# Patient Record
Sex: Female | Born: 1950 | Race: Black or African American | Hispanic: No | Marital: Married | Smoking: Never smoker
Health system: Southern US, Community
[De-identification: ages and names within clinical notes are randomized; demographics above are authoritative.]

## PROBLEM LIST (undated history)

## (undated) DIAGNOSIS — M1990 Unspecified osteoarthritis, unspecified site: Secondary | ICD-10-CM

## (undated) DIAGNOSIS — K589 Irritable bowel syndrome without diarrhea: Secondary | ICD-10-CM

## (undated) DIAGNOSIS — K269 Duodenal ulcer, unspecified as acute or chronic, without hemorrhage or perforation: Secondary | ICD-10-CM

## (undated) DIAGNOSIS — E669 Obesity, unspecified: Secondary | ICD-10-CM

## (undated) DIAGNOSIS — B9681 Helicobacter pylori [H. pylori] as the cause of diseases classified elsewhere: Secondary | ICD-10-CM

## (undated) DIAGNOSIS — H269 Unspecified cataract: Secondary | ICD-10-CM

## (undated) DIAGNOSIS — K5790 Diverticulosis of intestine, part unspecified, without perforation or abscess without bleeding: Secondary | ICD-10-CM

## (undated) DIAGNOSIS — R7303 Prediabetes: Secondary | ICD-10-CM

## (undated) DIAGNOSIS — I10 Essential (primary) hypertension: Secondary | ICD-10-CM

## (undated) DIAGNOSIS — K279 Peptic ulcer, site unspecified, unspecified as acute or chronic, without hemorrhage or perforation: Secondary | ICD-10-CM

## (undated) DIAGNOSIS — K219 Gastro-esophageal reflux disease without esophagitis: Secondary | ICD-10-CM

## (undated) HISTORY — PX: CATARACT EXTRACTION, BILATERAL: SHX1313

## (undated) HISTORY — DX: Gastro-esophageal reflux disease without esophagitis: K21.9

## (undated) HISTORY — PX: UTERINE FIBROID SURGERY: SHX826

## (undated) HISTORY — DX: Obesity, unspecified: E66.9

## (undated) HISTORY — DX: Unspecified cataract: H26.9

## (undated) HISTORY — PX: TUBAL LIGATION: SHX77

## (undated) HISTORY — PX: ABDOMINAL HYSTERECTOMY: SHX81

## (undated) HISTORY — PX: BLADDER SUSPENSION: SHX72

---

## 1999-02-05 ENCOUNTER — Other Ambulatory Visit: Admission: RE | Admit: 1999-02-05 | Discharge: 1999-02-05 | Payer: Self-pay | Admitting: Obstetrics and Gynecology

## 2001-04-22 ENCOUNTER — Other Ambulatory Visit: Admission: RE | Admit: 2001-04-22 | Discharge: 2001-04-22 | Payer: Self-pay | Admitting: Obstetrics and Gynecology

## 2001-06-09 ENCOUNTER — Ambulatory Visit (HOSPITAL_COMMUNITY): Admission: RE | Admit: 2001-06-09 | Discharge: 2001-06-09 | Payer: Self-pay | Admitting: Obstetrics and Gynecology

## 2001-06-09 ENCOUNTER — Encounter (INDEPENDENT_AMBULATORY_CARE_PROVIDER_SITE_OTHER): Payer: Self-pay | Admitting: Specialist

## 2002-08-18 ENCOUNTER — Other Ambulatory Visit: Admission: RE | Admit: 2002-08-18 | Discharge: 2002-08-18 | Payer: Self-pay | Admitting: Obstetrics and Gynecology

## 2003-10-17 ENCOUNTER — Other Ambulatory Visit: Admission: RE | Admit: 2003-10-17 | Discharge: 2003-10-17 | Payer: Self-pay | Admitting: Obstetrics and Gynecology

## 2005-01-16 ENCOUNTER — Other Ambulatory Visit: Admission: RE | Admit: 2005-01-16 | Discharge: 2005-01-16 | Payer: Self-pay | Admitting: Obstetrics and Gynecology

## 2005-02-01 ENCOUNTER — Encounter: Admission: RE | Admit: 2005-02-01 | Discharge: 2005-02-01 | Payer: Self-pay | Admitting: Obstetrics and Gynecology

## 2006-02-18 ENCOUNTER — Encounter (INDEPENDENT_AMBULATORY_CARE_PROVIDER_SITE_OTHER): Payer: Self-pay | Admitting: *Deleted

## 2006-02-18 ENCOUNTER — Ambulatory Visit (HOSPITAL_COMMUNITY): Admission: RE | Admit: 2006-02-18 | Discharge: 2006-02-19 | Payer: Self-pay | Admitting: Obstetrics and Gynecology

## 2006-03-04 ENCOUNTER — Encounter: Admission: RE | Admit: 2006-03-04 | Discharge: 2006-03-04 | Payer: Self-pay | Admitting: Obstetrics and Gynecology

## 2007-03-23 ENCOUNTER — Encounter: Admission: RE | Admit: 2007-03-23 | Discharge: 2007-03-23 | Payer: Self-pay | Admitting: Obstetrics and Gynecology

## 2007-04-23 ENCOUNTER — Ambulatory Visit: Payer: Self-pay | Admitting: Gastroenterology

## 2007-05-06 ENCOUNTER — Ambulatory Visit: Payer: Self-pay | Admitting: Gastroenterology

## 2009-03-07 ENCOUNTER — Ambulatory Visit (HOSPITAL_COMMUNITY): Admission: RE | Admit: 2009-03-07 | Discharge: 2009-03-08 | Payer: Self-pay | Admitting: Obstetrics and Gynecology

## 2010-10-14 ENCOUNTER — Encounter: Payer: Self-pay | Admitting: Obstetrics and Gynecology

## 2010-12-31 LAB — URINALYSIS, ROUTINE W REFLEX MICROSCOPIC
Hgb urine dipstick: NEGATIVE
Ketones, ur: NEGATIVE mg/dL
Protein, ur: NEGATIVE mg/dL
Urobilinogen, UA: 0.2 mg/dL (ref 0.0–1.0)
pH: 6 (ref 5.0–8.0)

## 2010-12-31 LAB — CBC
Hemoglobin: 13.5 g/dL (ref 12.0–15.0)
MCHC: 35.2 g/dL (ref 30.0–36.0)
MCHC: 34.5 g/dL (ref 30.0–36.0)
MCV: 85.1 fL (ref 78.0–100.0)
RBC: 3.88 MIL/uL (ref 3.87–5.11)
RBC: 4.6 MIL/uL (ref 3.87–5.11)
RDW: 13.6 % (ref 11.5–15.5)
RDW: 13.1 % (ref 11.5–15.5)
WBC: 8.8 10*3/uL (ref 4.0–10.5)

## 2010-12-31 LAB — COMPREHENSIVE METABOLIC PANEL
AST: 18 U/L (ref 0–37)
BUN: 6 mg/dL (ref 6–23)
CO2: 31 mEq/L (ref 19–32)
Chloride: 100 mEq/L (ref 96–112)
GFR calc Af Amer: >60 mL/min (ref >60)
GFR calc non Af Amer: >60 mL/min (ref >60)
Glucose, Bld: 109 mg/dL — ABNORMAL HIGH (ref 70–99)
Potassium: 3.4 mEq/L — ABNORMAL LOW (ref 3.5–5.1)

## 2011-02-05 NOTE — H&P (Signed)
NAME:  Zentner, Mickaela                ACCOUNT NO.:  [id number]   MEDICAL RECORD NO.:  [id number]          PATIENT TYPE:  AMB   LOCATION:  SDC                           FACILITY:  [facility]   PHYSICIAN:  Guy Sandifer. Henderson Cloud, M.D. DATE OF BIRTH:  1951-06-02   DATE OF ADMISSION:  DATE OF DISCHARGE:                              HISTORY & PHYSICAL   CHIEF COMPLAINT:  Pelvic relaxation and stress urinary continence.   HISTORY OF PRESENT ILLNESS:  This patient is a 60 year old married black  female G2, P2 status post laparoscopically-assisted vaginal hysterectomy  with bilateral salpingo-oophorectomy.  She has complaints of sensation  that something is protruding from the vagina and is quite uncomfortable.  There is also leaking urine with coughing and sneezing.  Urodynamics  were consistent with stress urinary incontinence.  On exam, she has  pelvic relaxation.  After discussion of options, she is being admitted  for anterior repair with probable graft as well as posterior vaginal  repair and mid urethral sling.  Potential risks and complications have  been discussed preoperatively.   PAST MEDICAL HISTORY:  1. Arthritis.  2. Chronic hypertension.   PAST SURGICAL HISTORY:  1. LAVH-BSO.  2. Tubal ligation.   OBSTETRIC HISTORY:  Vaginal delivery x2.   MEDICATIONS:  Lisinopril one half daily, vaginal estrogen cream twice a  week, Vivelle-Dot patch twice a week.   ALLERGIES:  No known drug allergies.   SOCIAL HISTORY:  Denies tobacco, alcohol, or drug abuse.   FAMILY HISTORY:  Positive for heart disease, kidney disease, arthritis,  diabetes, and chronic hypertension.   REVIEW OF SYSTEMS:  NEURO:  Denies headache.  CARDIAC:  Denies chest  pain.  PULMONARY:  Denies shortness of breath.   PHYSICAL EXAMINATION:  VITAL SIGNS:  Height 5 feet 4-1/2 inches, weight  213.4 pounds, blood pressure 110/72.  HEENT:  Without thyromegaly.  LUNGS:  Clear to auscultation.  HEART:  Regular rate and rhythm.  ABDOMEN:  Soft, nontender without masses.  PELVIC:  Vagina without lesion.  Cystocele is at the vaginal introitus.  Adnexa, nontender without masses.  Rectovaginal exam reveals good rectal  sphincter tone with a lax rectovaginal septum.  EXTREMITIES:  Grossly within normal limits.  NEUROLOGIC:  Grossly within normal limits.   ASSESSMENT:  1. Pelvic relaxation.  2. Stress urinary continence.   PLAN:  Anterior and posterior repair with probable graft and mid  urethral sling.      Guy Sandifer Henderson Cloud, M.D.  Electronically Signed     JET/MEDQ  D:  03/02/2009  T:  03/03/2009  Job:  [id number]

## 2011-02-05 NOTE — Discharge Summary (Signed)
NAME:  [name], Lori                ACCOUNT NO.:  [id number]   MEDICAL RECORD NO.:  [id number]          PATIENT TYPE:  OIB   LOCATION:  9311                          FACILITY:  [facility]   PHYSICIAN:  Guy Sandifer. Henderson Cloud, M.D. DATE OF BIRTH:  May 22, 1951   DATE OF ADMISSION:  03/07/2009  DATE OF DISCHARGE:  03/08/2009                               DISCHARGE SUMMARY   ADMITTING DIAGNOSES:  1. Pelvic relaxation.  2. Stress urinary incontinence.   DISCHARGE DIAGNOSES:  1. Pelvic relaxation.  2. Stress urinary incontinence.   PROCEDURE:  Solyx midurethral sling, anterior colporrhaphy with repair  of cystocele, vaginal colpopexy, insertion of mesh, posterior  colporrhaphy with repair of rectocele, and cystoscopy on March 07, 2009.   REASON FOR ADMISSION:  This patient is a 60 year old G2, P2 status post  LAVH-BSO with symptomatic pelvic relaxation and stress incontinence.  Details are dictated in the history and physical.  She is admitted for  surgical management.   HOSPITAL COURSE:  The patient admitted to the hospital, undergoes the  above procedures.  She tolerates them well and blood loss is 150 mL.  On  the evening of surgery, she has good pain relief.  Vital signs are  stable.  She remains afebrile with clear urine output.  On the day of  discharge, she has good pain relief.  Pack has been taken out.  Foley is  out.  She has not yet passed flatus or voided, but she is feeling well.  Abdomen is soft with excellent bowel sounds.  Hemoglobin is 11.7.  She  will be followed for at least 2 postvoid residual volumes prior to  discharge.   CONDITION ON DISCHARGE:  Good.   DIET:  Regular as tolerated.   ACTIVITY:  No lifting, no operation of automobiles, and no vaginal  entry.  She is to call our office for problems including but not limited  to temperature of 101 degrees; persistent nausea, vomiting; increasing  pain; or heavy vaginal bleeding.   MEDICATIONS:  1. Percocet 5/325 mg, #40,  1-2 p.o. q.6 h. p.r.n.  2. Ibuprofen 600 mg q.6 h. p.r.n.  3. Multivitamin daily.   She will resume her other preoperative medications.  She will resume  vaginal estrogen cream in about a week.   Followup is in the office in 3 weeks.      Guy Sandifer Henderson Cloud, M.D.  Electronically Signed     JET/MEDQ  D:  03/08/2009  T:  03/09/2009  Job:  [id number]

## 2011-02-05 NOTE — Op Note (Signed)
NAME:  Lori [name], Lori [name]                ACCOUNT NO.:  [id number]   MEDICAL RECORD NO.:  [id number]          PATIENT TYPE:  OIB   LOCATION:  9311                          FACILITY:  [facility]   PHYSICIAN:  Guy Sandifer. Henderson Cloud, M.D. DATE OF BIRTH:  1950/11/19   DATE OF PROCEDURE:  03/07/2009  DATE OF DISCHARGE:                               OPERATIVE REPORT   PREOPERATIVE DIAGNOSES:  1. Pelvic relaxation.  2. Stress urinary continence   POSTOPERATIVE DIAGNOSES:  1. Pelvic relaxation.  2. Stress urinary continence   PROCEDURES:  Solyx mid urethral sling, anterior colporrhaphy with repair  of cystocele, vaginal colpopexy, insertion of mesh, posterior colpopexy  with repair of rectocele, and cystoscopy.   SURGEON:  Guy Sandifer. Henderson Cloud, MD   ANESTHESIA:  General with endotracheal intubation.   ESTIMATED BLOOD LOSS:  150 mL.   INDICATIONS AND CONSENT:  This patient is a 61 year old married black  female G2, P2, status post LAVH-BSO with complaints of pelvic relaxation  and stress urinary continence.  Details are in the history and physical.  Anterior and posterior colporrhaphy with colpopexy and mesh as well as  mid urethral sling has been discussed preoperatively.  Potential risks  and complications have been discussed preoperatively including, but not  limited to infection, organ damage, bleeding requiring transfusion of  blood products with HIV and hepatitis acquisition, DVT, PE, pneumonia,  fistula formation, pelvic pain, laparotomy, need for postoperative  vaginal dilators, dyspareunia, recurrent pelvic relaxation.  Success and  failure rates of the mid urethral sling, recurrence of incontinence,  postoperative irritative voiding symptoms, return to the OR, prolonged  catheterization, self-catheterization, erosion and delayed healing of  the mesh had been reviewed as well.  All questions have been answered  and consent is signed on the chart.   PROCEDURE IN DETAILS:  The patient was taken to  the operating room where  she was identified, placed in dorsosupine position and general  anesthesia was induced via endotracheal intubation.  She was then placed  in the dorsal lithotomy position where she was prepped, bladder straight  catheterized and she draped in a sterile fashion.  The anterior vaginal  mucosa was injected with 0.5% lidocaine with 1:200,000 epinephrine.  A  transverse vaginal incision was made on the anterior vaginal wall at  about the halfway point from introitus to apex.  The anterior vaginal  mucosa was then dissected from the underlying bladder sharply and  bluntly.  Dissection was carried out to the ischial spines bilaterally.  Sacrospinous ligaments were identified.  The cystocele was reduced with  purse-string sutures with 0 Monocryl.  Then the Uphold graft was placed  through the sacrospinous ligaments bilaterally with a Capio needle  driver.  This was done at least 1 fingerbreadth medial to the ischial  spines bilaterally.  The suture for each arm of the Uphold graft was  pulled through pulling through the dilator and in the arm of the graft  bilaterally.  The Uphold was anchored to the backside of the vaginal  mucosa in the midline on both sides with 2-0 Vicryl suture.  Also  anchored on the sides of the graft as well.  This allowed the graft to  be laying flat without any rolls or kinks.  The arms were then pulled  through to the point that it elevates the anterior vaginal mucosa  properly.  The sheaths on the arms were then removed intact bilaterally  with the sheath as well as the suture within the sheath.  Excess length  of the arms were trimmed as well.  This gives good elevation to the  anterior compartment.  The vaginal mucosa was closed in running locking  fashion with 2-0 Monocryl suture.  The suburethral vaginal mucosa was  then injected with the same solution.  A small midline incision was  made.  A Foley catheter was placed in the bladder.  The  bladder was  drained and the catheter was left in place.  The Solyx polypropylene  mesh mid urethral sling was then placed first through the left and then  to the right side on the urogenital diaphragm.  Care was taken to orient  the middle of the sling which has been marked under the urethra.  After  noting the sling was lying flat with no kinks or rolls is opposed to the  urethra that a Kelly clamp can easily be pushed below the sling without  tension.  The right introducer was also released leaving the sling in  place.  The vaginal mucosa was closed with a running locking 2-0  Monocryl suture.  Foley catheter was then removed.  Cystoscopy with a 70-  degree cystoscope was then carried out, 360-degree inspection revealed  no foreign bodies, no evidence of perforation and a good puff of urine  from the ureters bilaterally.  Cystoscope was removed.  Foley catheter  was replaced and left in place.  A small diamond-shaped wedge of tissue  was then removed from the posterior perineal body.  The posterior  vaginal mucosa was injected with the same solution.  The posterior  vaginal mucosa was then taken down in the midline with Strully scissors.  Dissection was carried out bilaterally, sharply, and bluntly.  The  rectovaginal fascia was then reapproximated in the midline with  interrupted figure-of-eight 0 Monocryl sutures.  This gives good support  posteriorly.  Excess mucosa was trimmed and then reapproximated in a  running locking 2-0 Monocryl suture down at the level of the perineal  body.  Perineal body was then dissected out and reapproximated with 0  Monocryl figure-of-eights.  The closure of the mucosa was then completed  with the same 2-0 Monocryl suture which was carried on down and closing  the mucosa and skin in episiotomy-type fashion.  A vaginal packing with  estrogen cream was then placed in the vagina.  All counts were correct.  The patient was awakened and taken to the recovery  room in stable  condition.      Guy Sandifer Henderson Cloud, M.D.  Electronically Signed     JET/MEDQ  D:  03/07/2009  T:  03/08/2009  Job:  [id number]

## 2011-02-08 NOTE — H&P (Signed)
[facility]  Patient:    Lori, [name] Visit Number: [id number] MRN: [id number]          Service Type: Attending:  Guy Sandifer. Arleta Creek, M.D. Dictated by:   Guy Sandifer Arleta Creek, M.D. Proc. Date: 06/09/01 Adm. Date:  06/09/01                           History and Physical  CHIEF COMPLAINT:              Heavy irregular bleeding.  HISTORY OF PRESENT ILLNESS:   This patient is a 61 year old married black female, G2, P2, status post tubal ligation, who has had increasingly heavy and irregular menses.  On ultrasound, she has a uterus measuring 8.9 x 6.7 x 6.7 cm.  A probable submucosal myoma measuring 3.7 cm is noted. Two other intramural myomas measuring 1.7 cm each are also noted. Sonohysterogram is consistent with a 3.2-cm mass projecting into the uterine cavity.  After discussion of the options, hysteroscopy with D&C is recommended.  All questions have been answered and the patient is being admitted for this surgery.  PAST MEDICAL HISTORY:         Cervical dysplasia, 1997.  PAST SURGICAL HISTORY:        Tubal ligation.  OBSTETRICAL HISTORY:          Vaginal delivery x 2.  MEDICATIONS:                  Vitamins.  ALLERGIES:                    No known drug allergies.  FAMILY HISTORY:               Positive for diabetes in maternal aunt and uncle and cousins; chronic hypertension in mother and maternal grandmother; coronary artery disease, maternal grandmother and maternal aunt.  SOCIAL HISTORY:               Patient denies alcohol, tobacco or drug abuse.  REVIEW OF SYSTEMS:            Negative except as above.  PHYSICAL EXAMINATION:  VITAL SIGNS:                  Height 5 feet 5 inches.  Weight 213 pounds. Blood pressure 104/76.  HEENT:                        Without thyromegaly.  LUNGS:                        Clear to auscultation.  HEART:                        Regular rate and rhythm.  BACK:                         Without CVA  tenderness.  BREASTS:                      Without mass, retraction or discharge.  ABDOMEN:                      Soft, nontender, without masses.  PELVIC:  Vulva, vagina and cervix without lesion.  Uterus is upper limits of normal size and nontender.  Adnexa nontender, without masses.  EXTREMITIES:                  Grossly within normal limits.  NEUROLOGIC:                   Grossly within normal limits.  ASSESSMENT:                   Menometrorrhagia.  PLAN:                         Hysteroscopy with resectoscope and D&C. Dictated by:   Guy Sandifer Arleta Creek, M.D. Attending:  Guy Sandifer Arleta Creek, M.D. DD:  06/08/01 TD:  06/08/01 Job: [id number]

## 2011-02-08 NOTE — Discharge Summary (Signed)
NAME:  [name], Lori                ACCOUNT NO.:  [id number]   MEDICAL RECORD NO.:  [id number]          PATIENT TYPE:  OIB   LOCATION:  9309                          FACILITY:  [facility]   PHYSICIAN:  Guy Sandifer. Henderson Cloud, M.D. DATE OF BIRTH:  09/21/1951   DATE OF ADMISSION:  02/18/2006  DATE OF DISCHARGE:  02/19/2006                                 DISCHARGE SUMMARY   ADMITTING DIAGNOSIS:  Fibroids and endometrial polyps.   DISCHARGE DIAGNOSIS:  Fibroids and endometrial polyps.   PROCEDURE:  Laparoscopically-assisted vaginal hysterectomy, bilateral  salpingo-oophorectomy on Feb 18, 2006.   REASON FOR ADMISSION:  This patient is a 60 year old married black female  G3, P2 with recurrent endometrial polyps and uterine leiomyomata.  Details  are in the history and physical.  She is admitted for surgical management.   HOSPITAL COURSE:  The patient is taken to operating room, undergoes the  above procedure.  On the evening of surgery she has good pain relief.  She  did have nausea and vomiting one time treated with Reglan successfully.  Vital signs are stable.  She is afebrile with clear urine output.  On the  day of discharge she is passing flatus, tolerating a regular diet, and has  good pain relief.  Vital signs are stable and she remains afebrile.  Hemoglobin 11.2, white count 13.2, and pathology is pending.   CONDITION ON DISCHARGE:  Good.   DIET:  Regular, as tolerated.   ACTIVITY:  No lifting.  No operation of automobiles.  No vaginal entry.  She  is to call the office for problems including, but not limited to,  temperature of 101 degrees, heavy vaginal bleeding, increasing pain,  persistent nausea, vomiting.   MEDICATIONS:  1.  Percocet 5/325 mg #40 one to two p.o. q.6h. p.r.n.  2.  Ibuprofen 600 mg q.6h. p.r.n.  3.  Multivitamin daily.   Follow-up is in the office in two weeks.      Guy Sandifer Henderson Cloud, M.D.  Electronically Signed     JET/MEDQ  D:  02/19/2006  T:   02/19/2006  Job:  [id number]

## 2011-02-08 NOTE — Op Note (Signed)
NAME:  Lori [name], Lori [name]                ACCOUNT NO.:  [id number]   MEDICAL RECORD NO.:  [id number]          PATIENT TYPE:  OIB   LOCATION:  9309                          [facility]ACILITY:  [facility]   PHYSICIAN:  Guy Sandifer. Henderson Cloud, M.D. DATE OF BIRTH:  1950/12/27   DATE OF PROCEDURE:  DATE OF DISCHARGE:                                 OPERATIVE REPORT   no dictation      Guy Sandifer. Henderson Cloud, M.D.  Electronically Signed     JET/MEDQ  D:  02/19/2006  T:  02/19/2006  Job:  [id number]

## 2011-02-08 NOTE — Op Note (Signed)
[facility]  Patient:    Lori [name], Lori [name] Visit Number: [id number] MRN: [id number]          Service Type: DSU Location: [facility] Attending Physician:  Soledad Gerlach Dictated by:   Guy Sandifer Arleta Creek, M.D. Proc. Date: 06/09/01 Admit Date:  06/09/2001                             Operative Report  PREOPERATIVE DIAGNOSES:       Menometrorrhagia.  POSTOPERATIVE DIAGNOSES:      Submucosal leiomyoma and endometrial polyp.  PROCEDURE:                    Hysteroscopy with resection of submucosal leiomyoma and endometrial polyp.  SURGEON:                      Guy Sandifer. Arleta Creek, M.D.  ANESTHESIA:                   General with LMA.  ESTIMATED BLOOD LOSS:         Less than 100 cc.  INPUT AND OUTPUT:             Sorbitol distending media 90 cc deficit.  INDICATIONS AND CONSENT:      This patient is a 60 year old married black female G2, P2 status post tubal ligation with increasingly heavy menses. Details are dictated in the history and physical.  Hysteroscopy with resectoscope was discussed.  Potential risks and complications were discussed including, but not limited to, infection, uterine perforation with organ damage, bleeding requiring transfusion of blood products with possible transfusion reaction, HIV and hepatitis acquisition, DVT, PE, pneumonia, hysterectomy.  All questions are answered and consent is signed on the chart.  FINDINGS:                     There is a 6-8 mm pedunculated endometrial polyp in the lower posterior endometrial cavity.  There is approximately 3 cm submucosal leiomyoma essentially directly in the middle of the fundus of the uterine cavity.  No other abnormal structures or vessels are noted.  PROCEDURE:                    Patient is taken to the operating room, placed in the dorsal supine position where general anesthesia is induced via LMA. She is then placed in the dorsal lithotomy position where she is  prepped, bladder straight catheterized, and she is draped in a sterile fashion. Weighted speculum was placed.  The anterior cervical lip is injected with 1% Xylocaine and then grasped with a single tooth tenaculum.  Paracervical block is placed at the 2, 4, 5, 7, 8, and 10 oclock positions with 1% Xylocaine with approximately 20 cc total used.  Cervix is gently progressively dilated to a 33 Pratt dilator.  The resectoscope is placed in the cervix, then advanced under direct visualization using sorbitol distending media.  The above findings are noted.  The endometrial polyp is resected in a simple fashion.  The submucosal leiomyoma is then resected in pieces which are delivered and sent separately.  This is done with a single right angle wire loop.  Sharp curettage is then carried out.  All instrument counts are correct.  All instruments are removed.  Good hemostasis is noted.  The patient is awakened, taken to the recovery room in stable condition. Dictated  by:   Guy Sandifer Arleta Creek, M.D. Attending Physician:  Soledad Gerlach DD:  06/09/01 TD:  06/09/01 Job: [id number]

## 2011-02-08 NOTE — Op Note (Signed)
NAME:  Lori [name], Lori [name]                ACCOUNT NO.:  [id number]   MEDICAL RECORD NO.:  [id number]          PATIENT TYPE:  OIB   LOCATION:  9399                          FACILITY:  [facility]   PHYSICIAN:  Guy Sandifer. Henderson Cloud, M.D. DATE OF BIRTH:  07/03/1951   DATE OF PROCEDURE:  02/18/2006  DATE OF DISCHARGE:                                 OPERATIVE REPORT   PREOPERATIVE DIAGNOSES:  1.  Uterine leiomyomata.  2.  Endometrial polyps.   POSTOPERATIVE DIAGNOSES:  1.  Uterine leiomyomata.  2.  Endometrial polyps.   PROCEDURE:  Laparoscopically assisted vaginal hysterectomy with bilateral  salpingo-oophorectomy.   SURGEON:  Guy Sandifer. Henderson Cloud, MD   ASSISTANT:  Zelphia Cairo, MD   ANESTHESIA:  General with endotracheal intubation.   ESTIMATED BLOOD LOSS:  150 mL.   SPECIMENS:  Uterus, bilateral tubes and ovaries.   INDICATIONS AND CONSENT:  This patient is a 60 year old married black  female, G3, P2, status post tubal ligation with increasing heavy and  irregular bleeding.  Uterus is upper limits of normal size.  Ultrasound  confirms uterine leiomyomata and recurrent endometrial polyps.  After  discussion of the options of management, the patient is admitted for  laparoscopically assisted vaginal hysterectomy and bilateral salpingo-  oophorectomy.  Potential risks and complications have been discussed  preoperatively, including, but not limited to, infection, bowel, bladder or  ureteral damage, bleeding requiring transfusion of blood products with  possible transfusion reaction, HIV and hepatitis acquisition, DVT, PE and  pneumonia, laparotomy with fistula formation and postoperative dyspareunia.  All questions having been answered and consent is signed and on the chart.   FINDINGS:  Upper abdomen is grossly normal.  Uterus is about 8 weeks in  size, slightly irregular contour with uterine leiomyomata.  Tubes are status  post ligation bilaterally.  Ovaries are otherwise normal.  Anterior  and  posterior cul-de-sacs are normal.   DESCRIPTION OF PROCEDURE:  The patient is taken to the operating room, where  she is identified, placed in the dorsal supine position and general  anesthesia is induced via endotracheal intubation.  She is then placed in  the dorsal lithotomy position, where she is prepped abdominally and  vaginally, bladder straight-catheterized, Hulka tenaculum was placed in the  uterus as a manipulator and she is draped in a sterile fashion.  Plain  Marcaine 0.5% is injected subumbilically and suprapubically in the midline.  A small infraumbilical incision is then made.  A disposable Veress needle is  placed in the peritoneal cavity with a normal syringe and drop test.  Two  liters of gas are then insufflated under low pressure with good tympany in  the right upper quadrant.  The Veress needle is removed.  A 10-11 Excel  bladeless disposable trocar sleeve is then placed using the direct  visualization method with a diagnotic laparoscope.  After placement, the  operative laparoscope is placed.  A small suprapubic incision is made and a  5-mm bladeless Excel trocar sleeve is placed under direct visualization  without difficulty.  The above findings are noted.  The right  infundibulopelvic ligament  is taken down with the Gyrus bipolar cautery  cutting instrument.  Progressive bites are taken along the mesosalpinx,  across the round ligament and down to the level of the vesicouterine  peritoneum.  The same procedure is carried out on the left side.  Good  hemostasis is maintained.  The vesicouterine peritoneum is then incised in  the midline and dissected cephalolaterally with scissors.  Instruments were  removed and attention is turned to the vagina.  The cervix is circumscribed  with unipolar cautery.  Mucosa is advanced sharply and bluntly.  Uterosacral  ligaments are then taken down bilaterally with the Gyrus bipolar cautery  instrument.  The anterior and  posterior cul-de-sacs are then entered without  difficulty.  The cardinal ligaments followed by the uterine vessels are  taken down bilaterally.  The fundus with tubes and ovaries is delivered  posteriorly.  Proximal ligaments are cauterized and taken down and the  specimen is delivered.  The uterosacral ligaments are then plicated to the  vaginal cuff bilaterally with 0 Monocryl suture.  All sutures will be 0  Monocryl unless otherwise designated.  A suture is also placed at the 3 and  9 o'clock position on the cuff to assure hemostasis.  The uterosacral  ligaments are then plicated in the midline with a separate suture.  Cuff is  closed with figure-of-eights.  A Foley catheter is placed in the gallbladder  and clear urine is noted.  Attention is returned to the abdomen.  A small  number of bleeders at the peritoneal edges are cauterized with bipolar  cautery.  Excess fluid is removed.  Close inspection under reduced  pneumoperitoneum reveals excellent hemostasis.  Suprapubic trocar sleeve is  removed and good hemostasis is noted.  Pneumoperitoneum is completely  reduced and the umbilical trocar sleeve is removed.  A 0 Vicryl suture is  used to reapproximate the subcutaneous tissues in the umbilical incision  with care being taken not to pick up any underlying structures.  The skin on  the umbilical incision is closed with interrupted 3-0 Vicryl.  Dermabond is  applied to both incisions.  All counts are correct.  The patient is awakened  and taken to recovery room in stable condition.      Guy Sandifer Henderson Cloud, M.D.  Electronically Signed     JET/MEDQ  D:  02/18/2006  T:  02/18/2006  Job:  [id number]

## 2011-10-17 ENCOUNTER — Telehealth: Payer: Self-pay | Admitting: Gastroenterology

## 2011-10-17 NOTE — Telephone Encounter (Signed)
Pulled old chart and according to last report pt should have a repeat colon around May 05, 2014. Left message for Andrey Campanile to call back.  Left message on Sandy's voicemail with the date of the recall for the pt.

## 2012-08-22 ENCOUNTER — Encounter (HOSPITAL_COMMUNITY): Payer: Self-pay | Admitting: *Deleted

## 2012-08-22 ENCOUNTER — Emergency Department (HOSPITAL_COMMUNITY)
Admission: EM | Admit: 2012-08-22 | Discharge: 2012-08-22 | Disposition: A | Discharge status: Home / Self Care | Payer: BC Managed Care – PPO | Attending: Emergency Medicine | Admitting: Emergency Medicine

## 2012-08-22 ENCOUNTER — Emergency Department (HOSPITAL_COMMUNITY): Payer: BC Managed Care – PPO

## 2012-08-22 DIAGNOSIS — Z79899 Other long term (current) drug therapy: Secondary | ICD-10-CM | POA: Insufficient documentation

## 2012-08-22 DIAGNOSIS — S0100XA Unspecified open wound of scalp, initial encounter: Secondary | ICD-10-CM | POA: Insufficient documentation

## 2012-08-22 DIAGNOSIS — S0101XA Laceration without foreign body of scalp, initial encounter: Secondary | ICD-10-CM

## 2012-08-22 DIAGNOSIS — Y9389 Activity, other specified: Secondary | ICD-10-CM | POA: Insufficient documentation

## 2012-08-22 DIAGNOSIS — M25559 Pain in unspecified hip: Secondary | ICD-10-CM | POA: Insufficient documentation

## 2012-08-22 DIAGNOSIS — I10 Essential (primary) hypertension: Secondary | ICD-10-CM | POA: Insufficient documentation

## 2012-08-22 DIAGNOSIS — S0990XA Unspecified injury of head, initial encounter: Secondary | ICD-10-CM

## 2012-08-22 DIAGNOSIS — W19XXXA Unspecified fall, initial encounter: Secondary | ICD-10-CM

## 2012-08-22 DIAGNOSIS — W07XXXA Fall from chair, initial encounter: Secondary | ICD-10-CM | POA: Insufficient documentation

## 2012-08-22 DIAGNOSIS — Y929 Unspecified place or not applicable: Secondary | ICD-10-CM | POA: Insufficient documentation

## 2012-08-22 HISTORY — DX: Essential (primary) hypertension: I10

## 2012-08-22 MED ORDER — TETANUS-DIPHTH-ACELL PERTUSSIS 5-2.5-18.5 LF-MCG/0.5 IM SUSP: 0.5000 mL | Freq: Once | INTRAMUSCULAR | Status: DC

## 2012-08-22 MED ORDER — OXYCODONE-ACETAMINOPHEN 5-325 MG PO TABS
1.0000 | ORAL_TABLET | Freq: Once | ORAL | Status: AC
  Administered 2012-08-22: 1 via ORAL
  Filled 2012-08-22: qty 1

## 2012-08-22 MED ORDER — OXYCODONE-ACETAMINOPHEN 5-325 MG PO TABS: 1.0000 | ORAL_TABLET | Freq: Four times a day (QID) | ORAL | Status: DC | PRN

## 2012-08-22 NOTE — ED Notes (Signed)
Patient discharged using teach back method patient verbalized an understanding.

## 2012-08-22 NOTE — ED Notes (Signed)
Patient transported to X-ray 

## 2012-08-22 NOTE — ED Notes (Signed)
Reports falling backwards today and hitting her head, has laceration to back of head, minimal bleeding at this time. Denies syncope or loc.

## 2012-08-22 NOTE — ED Provider Notes (Signed)
History   This chart was scribed for Ethelda Chick, MD scribed by Magnus Sinning. The patient was seen in room TR05C/TR05C 14:30.   CSN: [id number]  Arrival date & time 08/22/12  1330   None     Chief Complaint  Patient presents with  . Fall  . Head Laceration    (Consider location/radiation/quality/duration/timing/severity/associated sxs/prior treatment) HPI Comments: Lori [name] is a 61 y.o. female who presents to the Emergency Department complaining of constant mild pain to the back of her head with associated right hip pain, as a result of a fall that occurred today. She says she was at church standing on a folding chair putting up a bulletin board when the chair collapsed causing the fall. She says he fell backwards hitting the back of her head onto the floor. She denies any syncope,LOC, or neck pain, and says it had some minimal bleeding.    Patient is a 61 y.o. female presenting with fall and scalp laceration. The history is provided by the patient. No language interpreter was used.  Fall The accident occurred 1 to 2 hours ago. The fall occurred while standing. The volume of blood lost was minimal. The point of impact was the head. The pain is present in the head. The pain is mild. She was ambulatory at the scene. There was no entrapment after the fall. There was no drug use involved in the accident. There was no alcohol use involved in the accident. Pertinent negatives include no loss of consciousness.  Head Laceration This is a new problem. The current episode started 1 to 2 hours ago. The problem has not changed since onset.She has tried nothing for the symptoms. The treatment provided no relief.   Pt states her last tetanus was within the last 5 years Past Medical History  Diagnosis Date  . Hypertension     History reviewed. No pertinent past surgical history.  History reviewed. No pertinent family history.  History  Substance Use Topics  . Smoking status: Not on  file  . Smokeless tobacco: Not on file  . Alcohol Use: No    Review of Systems  Neurological: Negative for loss of consciousness.  All other systems reviewed and are negative.    Allergies  Review of patient's allergies indicates no known allergies.  Home Medications   Current Outpatient Rx  Name  Route  Sig  Dispense  Refill  . CASANTHRANOL-DOCUSATE SODIUM 30-100 MG PO CAPS   Oral   Take 1 capsule by mouth daily as needed. For constipation.         [name] ESTRADIOL 0.1 MG/GM VA CREA   Vaginal   Place 2 g vaginally daily.         [name] ESTRADIOL 0.1 MG/24HR TD PTTW   Transdermal   Place 1 patch onto the skin 2 (two) times a week.         [name] LISINOPRIL-HYDROCHLOROTHIAZIDE 20-12.5 MG PO TABS   Oral   Take 1 tablet by mouth daily.         [name] ONE-DAILY MULTI VITAMINS PO TABS   Oral   Take 1 tablet by mouth daily.         . ALEVE PO   Oral   Take 1 capsule by mouth daily as needed. For joint pain.         . OXYCODONE-ACETAMINOPHEN 5-325 MG PO TABS   Oral   Take 1-2 tablets by mouth every 6 (six) hours as needed for pain.  15 tablet   0     BP 133/80  Pulse 95  Temp 98 F (36.7 C) (Oral)  Resp 18  SpO2 94%  Physical Exam  Nursing note and vitals reviewed. Constitutional: She is oriented to person, place, and time. She appears well-developed and well-nourished. No distress.  HENT:  Head: Normocephalic and atraumatic.       Small linear laceration of the posterior scalp with dry blood and no arterial bleeding.   Eyes: Conjunctivae normal and EOM are normal.  Neck: Neck supple. No tracheal deviation present.  Cardiovascular: Normal rate.   Pulmonary/Chest: Effort normal. No respiratory distress.  Abdominal: She exhibits no distension.  Musculoskeletal: Normal range of motion. She exhibits tenderness.       No midline tenderness of cervical thoracic or lumbar spine. Pain with ROM of right hip and tenderness to palpation of right lateral hip.     Neurological: She is alert and oriented to person, place, and time. No sensory deficit.  Skin: Skin is warm and dry.  Psychiatric: She has a normal mood and affect. Her behavior is normal.    ED Course  Procedures (including critical care time) DIAGNOSTIC STUDIES: Oxygen Saturation is 94% on room air, adequate by my interpretation.    COORDINATION OF CARE:  Labs Reviewed - No data to display Dg Hip Complete Right  08/22/2012  *RADIOLOGY REPORT*  Clinical Data: Larey Seat off chair  RIGHT HIP - COMPLETE 2+ VIEW  Comparison: None.  Findings: Negative for fracture.  Moderate degenerative change in the hip joint with joint space narrowing and spurring.  Negative for AVN.  No focal bony lesion.  IMPRESSION: Osteoarthritis of the right hip.  Negative for fracture.   Original Report Authenticated By: Janeece Riggers, M.D.     LACERATION REPAIR Performed by: Ethelda Chick Authorized by: Ethelda Chick Consent: Verbal consent obtained. Risks and benefits: risks, benefits and alternatives were discussed Consent given by: patient Patient identity confirmed: provided demographic data Prepped and Draped in normal sterile fashion Wound explored  Laceration Location: posterior scalp  Laceration Length: 3cm  No Foreign Bodies seen or palpated  Anesthesia: local infiltration  Local anesthetic: lidocaine 2% w  epinephrine  Anesthetic total: 4 ml  Irrigation method: syringe Amount of cleaning: standard  Skin closure: staples  Number of sutures: 6  Technique: staples  Patient tolerance: Patient tolerated the procedure well with no immediate complications.   1. Fall   2. Minor head injury   3. Scalp laceration   4. Hip pain       MDM  Pt presenting after fall from chair hitting posterior head- scalp laceration.  Also c/o right hip pain.  xrays show signs of arthritis.  Pt treated with staple closure of lac, percocet for pain.  Discharged with strict return precautions.  Pt  agreeable with plan.   I personally performed the services described in this documentation, which was scribed in my presence. The recorded information has been reviewed and is accurate.         Ethelda Chick, MD 08/22/12 [phone]

## 2012-08-22 NOTE — ED Notes (Signed)
Patient reports standing in chair and fell backward and hit head on door frame, no other complaints at this time.

## 2013-05-05 ENCOUNTER — Encounter: Payer: BC Managed Care – PPO | Attending: Family Medicine | Admitting: Dietician

## 2013-05-05 ENCOUNTER — Encounter: Payer: Self-pay | Admitting: Dietician

## 2013-05-05 VITALS — Ht 65.0 in | Wt 204.6 lb

## 2013-05-05 DIAGNOSIS — Z713 Dietary counseling and surveillance: Secondary | ICD-10-CM | POA: Insufficient documentation

## 2013-05-05 DIAGNOSIS — E669 Obesity, unspecified: Secondary | ICD-10-CM | POA: Insufficient documentation

## 2013-05-05 NOTE — Progress Notes (Signed)
  Medical Nutrition Therapy:  Appt start time: 0900 end time:  1000.   Assessment:  Primary concerns today: Doctor recommend that Caidynce talk to a dietitian because of pre-diabetes and weight gain. Reports that she hasn't been as physically active in the past 3 years until last month.  Alsace lives with her husband and has been retired for three years. She states she does most of the grocery shopping and husband does most of the cooking (though she does some. Eurydice's normal adult weight has been around 180 lbs with some fluctuation. She lost weight after having children by exercise and changing eating habits.   After her doctor called about pre-diabetes A1C (5.8%) around 7/24, she made changes such as walking 30 minutes per day 5 x week, avoiding fried foods, avoiding baked goods, and trying to plan out meals.  Cashae reports losing 6 pounds in last few weeks since she's made lifestyle changes.     MEDICATIONS: see list   DIETARY INTAKE:   24-hr recall:  B ( AM): kashi cereal with 2% milk sometimes cheerios with banana with water  Snk ( AM): sometimes carrots or nuts (less salt), skins (a few), cucumbers L ( PM): salads with roasted or grilled chicken/turkey, nuts, raisins, light dressing OR [place] sandwich on wheat bread with water , yogurt  Snk ( PM): sometimes carrots or nuts (less salt), skins (a few), cucumbers D ( PM): baked chicken, [place] burgers, yogurt  Snk ( PM): sometimes carrots or nuts (less salt), skins (a few), cucumbers Beverages: caffeine free soda  Usual physical activity: walks 30 minutes, 5 x week  Estimated energy needs: 1600 calories 180 g carbohydrates 120 g protein 44 g fat  Progress Towards Goal(s):  In progress.   Nutritional Diagnosis:  NB-1.1 Food and nutrition-related knowledge deficit As related to history of energy dense food choices and lack of exercise.  As evidenced by BMI of 34 and A1C of 5.8%.    Intervention:  Nutrition counseling provided.  Discussed how Keasha has already made several changes that should positively impact her blood sugar. Discussed the pathophysiology of insulin resistance and which factors blood glucose levels. Recommended that the continue her new good habits and make some minor changes such as switching to skim milk, pairing protein with CHO for snacks and meals, and having beverages with less CHO such as diet soda or low carb protein drinks instead of Boost for breakfast on-the-go.   Plan: Continue eating 3 meals and 2-3 snacks each day. Have protein with carbohydrates for snacks and breakfast. Continue filling half of lunch and dinner plates with vegetables. Continue walking 30 minutes per day 5 x week. Try skim milk instead of 2% milk. Trying protein shakes for quick breakfast meals instead of Boost.  Handouts given during visit include:  Living with Diabetes  My Plate  Protein Shakes  15g CHO snacks  Monitoring/Evaluation:  Dietary intake, exercise, and body weight prn.

## 2013-05-05 NOTE — Patient Instructions (Addendum)
Continue eating 3 meals and 2-3 snacks each day. Have protein with carbohydrates for snacks and breakfast. Continue filling half of lunch and dinner plates with vegetables. Continue walking 30 minutes per day 5 x week. Try skim milk instead of 2% milk. Trying protein shakes for quick breakfast meals instead of Boost.

## 2013-10-08 ENCOUNTER — Other Ambulatory Visit: Payer: Self-pay | Admitting: Family Medicine

## 2013-10-08 ENCOUNTER — Ambulatory Visit
Admission: RE | Admit: 2013-10-08 | Discharge: 2013-10-08 | Disposition: A | Discharge status: Home / Self Care | Payer: BC Managed Care – PPO | Source: Ambulatory Visit | Attending: Family Medicine | Admitting: Family Medicine

## 2013-10-08 DIAGNOSIS — M25562 Pain in left knee: Secondary | ICD-10-CM

## 2013-10-08 DIAGNOSIS — M25561 Pain in right knee: Secondary | ICD-10-CM

## 2013-10-12 ENCOUNTER — Ambulatory Visit: Payer: BC Managed Care – PPO | Attending: Family Medicine

## 2013-10-12 DIAGNOSIS — M1710 Unilateral primary osteoarthritis, unspecified knee: Secondary | ICD-10-CM | POA: Insufficient documentation

## 2013-10-12 DIAGNOSIS — R262 Difficulty in walking, not elsewhere classified: Secondary | ICD-10-CM | POA: Insufficient documentation

## 2013-10-12 DIAGNOSIS — M25569 Pain in unspecified knee: Secondary | ICD-10-CM | POA: Insufficient documentation

## 2013-10-12 DIAGNOSIS — IMO0001 Reserved for inherently not codable concepts without codable children: Secondary | ICD-10-CM | POA: Insufficient documentation

## 2013-10-12 DIAGNOSIS — I10 Essential (primary) hypertension: Secondary | ICD-10-CM | POA: Insufficient documentation

## 2013-10-12 DIAGNOSIS — M6281 Muscle weakness (generalized): Secondary | ICD-10-CM | POA: Insufficient documentation

## 2013-10-12 DIAGNOSIS — R269 Unspecified abnormalities of gait and mobility: Secondary | ICD-10-CM | POA: Insufficient documentation

## 2013-10-14 ENCOUNTER — Ambulatory Visit: Payer: BC Managed Care – PPO | Admitting: Physical Therapy

## 2013-10-19 ENCOUNTER — Ambulatory Visit: Payer: BC Managed Care – PPO | Admitting: Physical Therapy

## 2013-10-21 ENCOUNTER — Ambulatory Visit: Payer: BC Managed Care – PPO | Admitting: Physical Therapy

## 2013-10-25 ENCOUNTER — Ambulatory Visit: Payer: BC Managed Care – PPO | Attending: Family Medicine | Admitting: Physical Therapy

## 2013-10-25 DIAGNOSIS — M25569 Pain in unspecified knee: Secondary | ICD-10-CM | POA: Insufficient documentation

## 2013-10-25 DIAGNOSIS — R262 Difficulty in walking, not elsewhere classified: Secondary | ICD-10-CM | POA: Insufficient documentation

## 2013-10-25 DIAGNOSIS — IMO0001 Reserved for inherently not codable concepts without codable children: Secondary | ICD-10-CM | POA: Insufficient documentation

## 2013-10-25 DIAGNOSIS — R269 Unspecified abnormalities of gait and mobility: Secondary | ICD-10-CM | POA: Insufficient documentation

## 2013-10-25 DIAGNOSIS — I10 Essential (primary) hypertension: Secondary | ICD-10-CM | POA: Insufficient documentation

## 2013-10-25 DIAGNOSIS — M1710 Unilateral primary osteoarthritis, unspecified knee: Secondary | ICD-10-CM | POA: Insufficient documentation

## 2013-10-25 DIAGNOSIS — M6281 Muscle weakness (generalized): Secondary | ICD-10-CM | POA: Insufficient documentation

## 2013-10-27 ENCOUNTER — Ambulatory Visit: Payer: BC Managed Care – PPO

## 2013-11-01 ENCOUNTER — Ambulatory Visit: Payer: BC Managed Care – PPO | Admitting: Physical Therapy

## 2013-11-03 ENCOUNTER — Ambulatory Visit: Payer: BC Managed Care – PPO | Admitting: Physical Therapy

## 2014-02-18 ENCOUNTER — Encounter: Payer: Self-pay | Admitting: Gastroenterology

## 2014-04-26 ENCOUNTER — Ambulatory Visit (AMBULATORY_SURGERY_CENTER): Payer: Self-pay | Admitting: *Deleted

## 2014-04-26 VITALS — Ht 65.0 in | Wt 212.0 lb

## 2014-04-26 DIAGNOSIS — Z1211 Encounter for screening for malignant neoplasm of colon: Secondary | ICD-10-CM

## 2014-04-26 MED ORDER — NA SULFATE-K SULFATE-MG SULF 17.5-3.13-1.6 GM/177ML PO SOLN: 1.0000 | Freq: Once | ORAL | Status: DC

## 2014-04-26 NOTE — Progress Notes (Signed)
No egg or soy allergy. No anesthesia problems.  No home O2.  No diet meds.  

## 2014-05-02 ENCOUNTER — Encounter: Payer: Self-pay | Admitting: Gastroenterology

## 2014-05-10 ENCOUNTER — Ambulatory Visit (AMBULATORY_SURGERY_CENTER): Payer: BC Managed Care – PPO | Admitting: Gastroenterology

## 2014-05-10 ENCOUNTER — Encounter: Payer: Self-pay | Admitting: Gastroenterology

## 2014-05-10 VITALS — BP 111/75 | HR 65 | Temp 97.0°F | Resp 16 | Ht 65.0 in | Wt 212.0 lb

## 2014-05-10 DIAGNOSIS — Z86010 Personal history of colonic polyps: Secondary | ICD-10-CM

## 2014-05-10 DIAGNOSIS — Z1211 Encounter for screening for malignant neoplasm of colon: Secondary | ICD-10-CM

## 2014-05-10 MED ORDER — SODIUM CHLORIDE 0.9 % IV SOLN: 500.0000 mL | INTRAVENOUS | Status: DC

## 2014-05-10 NOTE — Op Note (Addendum)
[facility] [address] [place], [id number]   COLONOSCOPY PROCEDURE REPORT  PATIENT: [name], Lori W.  MR#: [id number] BIRTHDATE: [birth year] , 63  yrs. old GENDER: Female ENDOSCOPIST: Louis Meckelobert D Vernecia Umble, MD REFERRED BY: PROCEDURE DATE:  05/10/2014 PROCEDURE:   Colonoscopy, diagnostic First Screening Colonoscopy - Avg.  risk and is 50 yrs.  old or older - No.  Prior Negative Screening - Now for repeat screening. Other: See Comments  History of Adenoma - Now for follow-up colonoscopy & has been > or = to 3 yrs.  Yes hx of adenoma.  Has been 3 or more years since last colonoscopy.  Polyps Removed Today? No.  Recommend repeat exam, <10 yrs? No. ASA CLASS:   Class II INDICATIONS:Patient's personal history of colon polyps, last colonoscopy 2008 negative for polyps MEDICATIONS: MAC sedation, administered by CRNA and propofol (Diprivan) 250mg  IV  DESCRIPTION OF PROCEDURE:   After the risks benefits and alternatives of the procedure were thoroughly explained, informed consent was obtained.  A digital rectal exam revealed external hemorrhoids.   The LB [id number]  endoscope was introduced through the anus and advanced to the terminal ileum which was intubated for a short distance. No adverse events experienced. The quality of the prep was excellent using Suprep  The instrument was then slowly withdrawn as the colon was fully examined.      COLON FINDINGS: The mucosa appeared normal in the terminal ileum. A normal appearing cecum, ileocecal valve, and appendiceal orifice were identified.  The ascending, hepatic flexure, transverse, splenic flexure, descending, sigmoid colon and rectum appeared unremarkable.  No polyps or cancers were seen.  Retroflexed views revealed no abnormalities. The time to cecum=2 minutes 30 seconds. Withdrawal time=6 minutes 09 seconds.  The scope was withdrawn and the procedure completed. COMPLICATIONS: There were no  complications.  ENDOSCOPIC IMPRESSION: 1.   Normal mucosa in the terminal ileum 2.   Normal colon  RECOMMENDATIONS: Colonoscopy 10 years   eSigned:  Louis Meckelobert D Judine Arciniega, MD 05/10/2014 9:42 AM Revised: 05/10/2014 9:42 AM  cc: Carilyn GoodpastureWillard, Jennifer MD   PATIENT NAME:  [name], Lori W. MR#: [id number]

## 2014-05-10 NOTE — Patient Instructions (Signed)
YOU HAD AN ENDOSCOPIC PROCEDURE TODAY AT THE [facility]: Refer to the procedure report that was given to you for any specific questions about what was found during the examination.  If the procedure report does not answer your questions, please call your gastroenterologist to clarify.  If you requested that your care partner not be given the details of your procedure findings, then the procedure report has been included in a sealed envelope for you to review at your convenience later.  YOU SHOULD EXPECT: Some feelings of bloating in the abdomen. Passage of more gas than usual.  Walking can help get rid of the air that was put into your GI tract during the procedure and reduce the bloating. If you had a lower endoscopy (such as a colonoscopy or flexible sigmoidoscopy) you may notice spotting of blood in your stool or on the toilet paper. If you underwent a bowel prep for your procedure, then you may not have a normal bowel movement for a few days.  DIET: Your first meal following the procedure should be a light meal and then it is ok to progress to your normal diet.  A half-sandwich or bowl of soup is an example of a good first meal.  Heavy or fried foods are harder to digest and may make you feel nauseous or bloated.  Likewise meals heavy in dairy and vegetables can cause extra gas to form and this can also increase the bloating.  Drink plenty of fluids but you should avoid alcoholic beverages for 24 hours.  ACTIVITY: Your care partner should take you home directly after the procedure.  You should plan to take it easy, moving slowly for the rest of the day.  You can resume normal activity the day after the procedure however you should NOT DRIVE or use heavy machinery for 24 hours (because of the sedation medicines used during the test).    SYMPTOMS TO REPORT IMMEDIATELY: A gastroenterologist can be reached at any hour.  During normal business hours, 8:30 AM to 5:00 PM Monday through Friday,  call [phone].  After hours and on weekends, please call the GI answering service at [phone] who will take a message and have the physician on call contact you.   Following lower endoscopy (colonoscopy or flexible sigmoidoscopy):  Excessive amounts of blood in the stool  Significant tenderness or worsening of abdominal pains  Swelling of the abdomen that is new, acute  Fever of 100F or higher    FOLLOW UP: If any biopsies were taken you will be contacted by phone or by letter within the next 1-3 weeks.  Call your gastroenterologist if you have not heard about the biopsies in 3 weeks.  Our staff will call the home number listed on your records the next business day following your procedure to check on you and address any questions or concerns that you may have at that time regarding the information given to you following your procedure. This is a courtesy call and so if there is no answer at the home number and we have not heard from you through the emergency physician on call, we will assume that you have returned to your regular daily activities without incident.  SIGNATURES/CONFIDENTIALITY: You and/or your care partner have signed paperwork which will be entered into your electronic medical record.  These signatures attest to the fact that that the information above on your After Visit Summary has been reviewed and is understood.  Full responsibility of the confidentiality   of this discharge information lies with you and/or your care-partner.     

## 2014-05-10 NOTE — Progress Notes (Signed)
Report to PACU, RN, vss, BBS= Clear.  

## 2014-05-11 ENCOUNTER — Telehealth: Payer: Self-pay | Admitting: *Deleted

## 2014-05-11 NOTE — Telephone Encounter (Signed)
  Follow up Call-  Call back number 05/10/2014  Post procedure Call Back phone  # [phone]  Permission to leave phone message Yes     Patient questions:  Do you have a fever, pain , or abdominal swelling? No. Pain Score  0 *  Have you tolerated food without any problems? Yes.    Have you been able to return to your normal activities? Yes.    Do you have any questions about your discharge instructions: Diet   No. Medications  No. Follow up visit  No.  Do you have questions or concerns about your Care? No.  Actions: * If pain score is 4 or above: No action needed, pain <4.

## 2014-05-16 ENCOUNTER — Other Ambulatory Visit: Payer: Self-pay | Admitting: Obstetrics and Gynecology

## 2014-05-17 LAB — CYTOLOGY - PAP

## 2014-08-19 ENCOUNTER — Emergency Department (HOSPITAL_COMMUNITY)
Admission: EM | Admit: 2014-08-19 | Discharge: 2014-08-20 | Disposition: A | Discharge status: Home / Self Care | Payer: BC Managed Care – PPO | Attending: Emergency Medicine | Admitting: Emergency Medicine

## 2014-08-19 ENCOUNTER — Encounter (HOSPITAL_COMMUNITY): Payer: Self-pay | Admitting: Emergency Medicine

## 2014-08-19 DIAGNOSIS — Z79899 Other long term (current) drug therapy: Secondary | ICD-10-CM | POA: Diagnosis not present

## 2014-08-19 DIAGNOSIS — R0789 Other chest pain: Secondary | ICD-10-CM | POA: Diagnosis not present

## 2014-08-19 DIAGNOSIS — Z793 Long term (current) use of hormonal contraceptives: Secondary | ICD-10-CM | POA: Insufficient documentation

## 2014-08-19 DIAGNOSIS — I10 Essential (primary) hypertension: Secondary | ICD-10-CM | POA: Diagnosis not present

## 2014-08-19 DIAGNOSIS — R079 Chest pain, unspecified: Secondary | ICD-10-CM

## 2014-08-19 DIAGNOSIS — Z9842 Cataract extraction status, left eye: Secondary | ICD-10-CM | POA: Insufficient documentation

## 2014-08-19 DIAGNOSIS — Z9841 Cataract extraction status, right eye: Secondary | ICD-10-CM | POA: Insufficient documentation

## 2014-08-19 DIAGNOSIS — E669 Obesity, unspecified: Secondary | ICD-10-CM | POA: Insufficient documentation

## 2014-08-19 LAB — CBC
HEMATOCRIT: 37.9 % (ref 36.0–46.0)
HEMOGLOBIN: 12.7 g/dL (ref 12.0–15.0)
MCH: 27.6 pg (ref 26.0–34.0)
MCHC: 33.5 g/dL (ref 30.0–36.0)
MCV: 82.4 fL (ref 78.0–100.0)
Platelets: 254 10*3/uL (ref 150–400)
RBC: 4.6 MIL/uL (ref 3.87–5.11)
RDW: 12.9 % (ref 11.5–15.5)
WBC: 7.6 10*3/uL (ref 4.0–10.5)

## 2014-08-19 LAB — I-STAT TROPONIN, ED: Troponin i, poc: 0 ng/mL (ref 0.00–0.08)

## 2014-08-19 NOTE — ED Notes (Signed)
Pt was at home watching movie and shortly after 10pm began having "fluttering" in her chest and pressure. PT had 324 at home no nitro given. Pt is currently pain free.

## 2014-08-19 NOTE — ED Provider Notes (Signed)
CSN: [id number]     Arrival date & time 08/19/14  2303 History   First MD Initiated Contact with Patient 08/19/14 2308     Chief Complaint  Patient presents with  . Chest Pain     (Consider location/radiation/quality/duration/timing/severity/associated sxs/prior Treatment) HPI Comments: Patient with history of hypertension, presents to the emergency department with chief complaint of chest tightness and fluttering in her chest. She states the symptoms started this evening while she was watching movie. She denies any associated shortness of breath or radiating symptoms. She does report some associated diaphoresis. She denies any nausea, vomiting, diarrhea, or constipation. She has taken aspirin at home.  She states that her level of discomfort is 5 out of 10. She denies any cardiac history. Denies any history of PE or DVT.  Cardiac risk factors include hypertension, family history, and age.  The history is provided by the patient. No language interpreter was used.    Past Medical History  Diagnosis Date  . Hypertension   . Obesity   . Cataract     surgery   Past Surgical History  Procedure Laterality Date  . Abdominal hysterectomy    . Bladder suspension    . Cataract extraction, bilateral    . Uterine fibroid surgery    . Tubal ligation     Family History  Problem Relation Age of Onset  . Hypertension Other   . Hyperlipidemia Other   . Heart disease Other   . Diabetes Other   . Obesity Other   . Colon cancer Neg Hx    History  Substance Use Topics  . Smoking status: Never Smoker   . Smokeless tobacco: Never Used  . Alcohol Use: No   OB History    No data available     Review of Systems  Constitutional: Negative for fever and chills.  Respiratory: Negative for shortness of breath.   Cardiovascular: Positive for chest pain.  Gastrointestinal: Negative for nausea, vomiting, diarrhea and constipation.  Genitourinary: Negative for dysuria.  All other systems  reviewed and are negative.     Allergies  Review of patient's allergies indicates no known allergies.  Home Medications   Prior to Admission medications   Medication Sig Start Date End Date Taking? Authorizing Provider  calcium carbonate (OS-CAL) 600 MG TABS tablet Take 600 mg by mouth 2 (two) times daily with a meal.    Historical Provider, MD  Casanthranol-Docusate Sodium 30-100 MG CAPS Take 1 capsule by mouth daily as needed. For constipation.    Historical Provider, MD  estradiol (ESTRACE) 0.1 MG/GM vaginal cream Place 2 g vaginally daily.    Historical Provider, MD  estradiol (VIVELLE-DOT) 0.1 MG/24HR Place 1 patch onto the skin 2 (two) times a week.    Historical Provider, MD  lisinopril-hydrochlorothiazide (PRINZIDE,ZESTORETIC) 20-12.5 MG per tablet Take 1 tablet by mouth daily.    Historical Provider, MD  MULTIPLE VITAMIN PO Take by mouth.    Historical Provider, MD  Naproxen Sodium (ALEVE PO) Take 1 capsule by mouth daily as needed. For joint pain.    Historical Provider, MD   BP 126/63 mmHg  Pulse 67  Temp(Src) 97.8 F (36.6 C) (Oral)  Resp 14  Ht 5\' 5"  (1.651 m)  Wt 200 lb (90.719 kg)  BMI 33.28 kg/m2  SpO2 99% Physical Exam  Constitutional: She is oriented to person, place, and time. She appears well-developed and well-nourished.  HENT:  Head: Normocephalic and atraumatic.  Eyes: Conjunctivae and EOM are normal. Pupils are  equal, round, and reactive to light.  Neck: Normal range of motion. Neck supple.  Cardiovascular: Normal rate and regular rhythm.  Exam reveals no gallop and no friction rub.   No murmur heard. Pulmonary/Chest: Effort normal and breath sounds normal. No respiratory distress. She has no wheezes. She has no rales. She exhibits no tenderness.  Abdominal: Soft. Bowel sounds are normal. She exhibits no distension and no mass. There is no tenderness. There is no rebound and no guarding.  Musculoskeletal: Normal range of motion. She exhibits no edema or  tenderness.  Neurological: She is alert and oriented to person, place, and time.  Skin: Skin is warm and dry.  Psychiatric: She has a normal mood and affect. Her behavior is normal. Judgment and thought content normal.  Nursing note and vitals reviewed.   ED Course  Procedures (including critical care time) Labs Review Labs Reviewed  CBC  BASIC METABOLIC PANEL  I-STAT TROPOININ, ED    Imaging Review No results found.   EKG Interpretation None     ED ECG REPORT  I personally interpreted this EKG   Date: 08/20/2014   Rate: 76  Rhythm: normal sinus rhythm  QRS Axis: normal  Intervals: normal  ST/T Wave abnormalities: normal  Conduction Disutrbances:none  Narrative Interpretation:   Old EKG Reviewed: none available   MDM   Final diagnoses:  Chest pain    Patient with chest pain that started approximately one hour ago. Cardiac risk factors include hypertension, family history.  Heart score is 3.  Consider observation admission and chest pain rule out.  12:27 AM Patient seen by and discussed with Dr. Romeo AppleHarrison.  12:43 AM Patient signed out to Dr. Romeo AppleHarrison, who will follow-up on delta troponin at 2:30.  Roxy Horsemanobert Clarke Amburn, PA-C 08/20/14 [id number]  Purvis SheffieldForrest Harrison, MD 08/20/14 [phone]

## 2014-08-20 ENCOUNTER — Emergency Department (HOSPITAL_COMMUNITY): Payer: BC Managed Care – PPO

## 2014-08-20 LAB — BASIC METABOLIC PANEL
Anion gap: 13 (ref 5–15)
BUN: 12 mg/dL (ref 6–23)
CHLORIDE: 101 meq/L (ref 96–112)
CO2: 24 meq/L (ref 19–32)
CREATININE: 0.7 mg/dL (ref 0.50–1.10)
Calcium: 8.9 mg/dL (ref 8.4–10.5)
GFR calc Af Amer: >90 mL/min (ref >90)
GFR calc non Af Amer: >90 mL/min (ref >90)
GLUCOSE: 123 mg/dL — AB (ref 70–99)
Potassium: 3.8 mEq/L (ref 3.7–5.3)
Sodium: 138 mEq/L (ref 137–147)

## 2014-08-20 LAB — TROPONIN I: Troponin I: <0.3 ng/mL (ref <0.30)

## 2014-08-20 MED ORDER — HYDROMORPHONE HCL 1 MG/ML IJ SOLN
0.5000 mg | Freq: Once | INTRAMUSCULAR | Status: AC
  Administered 2014-08-20: 0.5 mg via INTRAVENOUS
  Filled 2014-08-20: qty 1

## 2014-08-20 MED ORDER — ACETAMINOPHEN 325 MG PO TABS
650.0000 mg | ORAL_TABLET | Freq: Once | ORAL | Status: AC
  Administered 2014-08-20: 650 mg via ORAL
  Filled 2014-08-20: qty 2

## 2014-08-20 NOTE — ED Notes (Signed)
Discharge instructions reviewed with pt. Pt verbalized understanding.   

## 2014-08-20 NOTE — Discharge Instructions (Signed)

## 2014-08-20 NOTE — ED Notes (Signed)
Dr Harrison at bedside

## 2016-05-29 ENCOUNTER — Ambulatory Visit (HOSPITAL_COMMUNITY)
Admission: EM | Admit: 2016-05-29 | Discharge: 2016-05-29 | Disposition: A | Discharge status: Home / Self Care | Payer: BC Managed Care – PPO | Attending: Internal Medicine | Admitting: Internal Medicine

## 2016-05-29 ENCOUNTER — Ambulatory Visit (INDEPENDENT_AMBULATORY_CARE_PROVIDER_SITE_OTHER): Payer: BC Managed Care – PPO

## 2016-05-29 ENCOUNTER — Encounter (HOSPITAL_COMMUNITY): Payer: Self-pay | Admitting: Emergency Medicine

## 2016-05-29 ENCOUNTER — Other Ambulatory Visit: Payer: Self-pay | Admitting: Obstetrics and Gynecology

## 2016-05-29 DIAGNOSIS — M1711 Unilateral primary osteoarthritis, right knee: Secondary | ICD-10-CM

## 2016-05-29 DIAGNOSIS — M25561 Pain in right knee: Secondary | ICD-10-CM

## 2016-05-29 DIAGNOSIS — M79604 Pain in right leg: Secondary | ICD-10-CM

## 2016-05-29 DIAGNOSIS — M179 Osteoarthritis of knee, unspecified: Secondary | ICD-10-CM | POA: Diagnosis not present

## 2016-05-29 DIAGNOSIS — R928 Other abnormal and inconclusive findings on diagnostic imaging of breast: Secondary | ICD-10-CM

## 2016-05-29 MED ORDER — INDOMETHACIN 50 MG PO CAPS: 50.0000 mg | ORAL_CAPSULE | Freq: Two times a day (BID) | ORAL | 1 refills | Status: DC

## 2016-05-29 NOTE — ED Provider Notes (Signed)
CSN: [id number]     Arrival date & time 05/29/16  1730 History   First MD Initiated Contact with Patient 05/29/16 1837     Chief Complaint  Patient presents with  . Leg Pain   (Consider location/radiation/quality/duration/timing/severity/associated sxs/prior Treatment) HPI History obtained from patient:  Pt presents with the cc of:  Right leg pain Duration of symptoms: Today Treatment prior to arrival: None Context: Sudden onset of excruciating pain while walking upstairs today right knee and calf. Other symptoms include: Pain with bending of the knee Pain score: Sitting still to, while trying to walk 10 FAMILY HISTORY: Hypertension    Past Medical History:  Diagnosis Date  . Cataract    surgery  . Hypertension   . Obesity    Past Surgical History:  Procedure Laterality Date  . ABDOMINAL HYSTERECTOMY    . BLADDER SUSPENSION    . CATARACT EXTRACTION, BILATERAL    . TUBAL LIGATION    . UTERINE FIBROID SURGERY     Family History  Problem Relation Age of Onset  . Hypertension Other   . Hyperlipidemia Other   . Heart disease Other   . Diabetes Other   . Obesity Other   . Colon cancer Neg Hx    Social History  Substance Use Topics  . Smoking status: Never Smoker  . Smokeless tobacco: Never Used  . Alcohol use No   OB History    No data available     Review of Systems  Denies: HEADACHE, NAUSEA, ABDOMINAL PAIN, CHEST PAIN, CONGESTION, DYSURIA, SHORTNESS OF BREATH  Allergies  Review of patient's allergies indicates no known allergies.  Home Medications   Prior to Admission medications   Medication Sig Start Date End Date Taking? Authorizing Provider  calcium carbonate (OS-CAL) 600 MG TABS tablet Take 600 mg by mouth daily.    Yes Historical Provider, MD  estradiol (VIVELLE-DOT) 0.1 MG/24HR Place 1 patch onto the skin 2 (two) times a week.   Yes Historical Provider, MD  lisinopril-hydrochlorothiazide (PRINZIDE,ZESTORETIC) 20-12.5 MG per tablet Take 1 tablet by  mouth daily.   Yes Historical Provider, MD  MULTIPLE VITAMIN PO Take 1 tablet by mouth daily.    Yes Historical Provider, MD  Naproxen Sodium (ALEVE PO) Take 1 capsule by mouth daily as needed. For joint pain.   Yes Historical Provider, MD  Casanthranol-Docusate Sodium 30-100 MG CAPS Take 1 capsule by mouth daily as needed. For constipation.    Historical Provider, MD  estradiol (ESTRACE) 0.1 MG/GM vaginal cream Place 2 g vaginally 2 (two) times a week.     Historical Provider, MD  indomethacin (INDOCIN) 50 MG capsule Take 1 capsule (50 mg total) by mouth 2 (two) times daily with a meal. 05/29/16   Tharon AquasFrank C Patrick, PA   Meds Ordered and Administered this Visit  Medications - No data to display  BP 127/68 (BP Location: Left Arm)   Pulse 69   Temp 98.4 F (36.9 C) (Oral)   SpO2 100%  No data found.   Physical Exam NURSES NOTES AND VITAL SIGNS REVIEWED. CONSTITUTIONAL: Well developed, well nourished, no acute distress HEENT: normocephalic, atraumatic EYES: Conjunctiva normal NECK:normal ROM, supple, no adenopathy PULMONARY:No respiratory distress, normal effort ABDOMINAL: Soft, ND, NT BS+, No CVAT MUSCULOSKELETAL: Normal ROM of all extremities, RIGHT KNEE: TENDER MEDIAL ASPECT. JOINT IS STABLE. NO ACHILLES TENDERNESS OR STEP OFF.  SKIN: warm and dry without rash PSYCHIATRIC: Mood and affect, behavior are normal  Urgent Care Course   Clinical Course  Procedures (including critical care time)  Labs Review Labs Reviewed - No data to display  Imaging Review No results found.   Visual Acuity Review  Right Eye Distance:   Left Eye Distance:   Bilateral Distance:    Right Eye Near:   Left Eye Near:    Bilateral Near:         MDM   1. Right leg pain   2. Right knee pain   3. Osteoarthritis of right knee, unspecified osteoarthritis type     Patient is reassured that there are no issues that require transfer to higher level of care at this time or additional  tests. Patient is advised to continue home symptomatic treatment. Patient is advised that if there are new or worsening symptoms to attend the emergency department, contact primary care provider, or return to UC. Instructions of care provided discharged home in stable condition.    THIS NOTE WAS GENERATED USING A VOICE RECOGNITION SOFTWARE PROGRAM. ALL REASONABLE EFFORTS  WERE MADE TO PROOFREAD THIS DOCUMENT FOR ACCURACY.  I have verbally reviewed the discharge instructions with the patient. A printed AVS was given to the patient.  All questions were answered prior to discharge. ,    Tharon Aquas, PA 05/30/16 1503    Eustace Moore, MD 07/11/16 [phone]

## 2016-05-29 NOTE — ED Triage Notes (Signed)
[facility] a [facility] chronic lower right leg pain that she believed to be arthritis from a previous injury.

## 2016-06-04 ENCOUNTER — Ambulatory Visit
Admission: RE | Admit: 2016-06-04 | Discharge: 2016-06-04 | Disposition: A | Discharge status: Home / Self Care | Payer: BC Managed Care – PPO | Source: Ambulatory Visit | Attending: Obstetrics and Gynecology | Admitting: Obstetrics and Gynecology

## 2016-06-04 DIAGNOSIS — R928 Other abnormal and inconclusive findings on diagnostic imaging of breast: Secondary | ICD-10-CM

## 2016-07-16 ENCOUNTER — Other Ambulatory Visit: Payer: Self-pay | Admitting: Orthopedic Surgery

## 2016-07-16 DIAGNOSIS — M25561 Pain in right knee: Secondary | ICD-10-CM

## 2016-07-27 ENCOUNTER — Ambulatory Visit
Admission: RE | Admit: 2016-07-27 | Discharge: 2016-07-27 | Disposition: A | Discharge status: Home / Self Care | Payer: BC Managed Care – PPO | Source: Ambulatory Visit | Attending: Orthopedic Surgery | Admitting: Orthopedic Surgery

## 2016-07-27 DIAGNOSIS — M25561 Pain in right knee: Secondary | ICD-10-CM

## 2016-08-13 ENCOUNTER — Other Ambulatory Visit: Payer: Self-pay | Admitting: Orthopedic Surgery

## 2016-08-19 ENCOUNTER — Other Ambulatory Visit: Payer: Self-pay | Admitting: Orthopedic Surgery

## 2016-09-12 ENCOUNTER — Encounter (HOSPITAL_BASED_OUTPATIENT_CLINIC_OR_DEPARTMENT_OTHER): Payer: Self-pay | Admitting: *Deleted

## 2016-09-13 ENCOUNTER — Other Ambulatory Visit: Payer: Self-pay

## 2016-09-13 ENCOUNTER — Encounter (HOSPITAL_BASED_OUTPATIENT_CLINIC_OR_DEPARTMENT_OTHER)
Admission: RE | Admit: 2016-09-13 | Discharge: 2016-09-13 | Disposition: A | Discharge status: Home / Self Care | Payer: BC Managed Care – PPO | Source: Ambulatory Visit | Attending: Orthopedic Surgery | Admitting: Orthopedic Surgery

## 2016-09-13 DIAGNOSIS — R7303 Prediabetes: Secondary | ICD-10-CM | POA: Diagnosis not present

## 2016-09-13 DIAGNOSIS — Z79899 Other long term (current) drug therapy: Secondary | ICD-10-CM | POA: Diagnosis not present

## 2016-09-13 DIAGNOSIS — Z6834 Body mass index (BMI) 34.0-34.9, adult: Secondary | ICD-10-CM | POA: Diagnosis not present

## 2016-09-13 DIAGNOSIS — M23321 Other meniscus derangements, posterior horn of medial meniscus, right knee: Secondary | ICD-10-CM | POA: Diagnosis present

## 2016-09-13 DIAGNOSIS — M23361 Other meniscus derangements, other lateral meniscus, right knee: Secondary | ICD-10-CM | POA: Diagnosis not present

## 2016-09-13 DIAGNOSIS — I10 Essential (primary) hypertension: Secondary | ICD-10-CM | POA: Insufficient documentation

## 2016-09-13 DIAGNOSIS — Z7982 Long term (current) use of aspirin: Secondary | ICD-10-CM | POA: Diagnosis not present

## 2016-09-13 DIAGNOSIS — M2241 Chondromalacia patellae, right knee: Secondary | ICD-10-CM | POA: Diagnosis not present

## 2016-09-13 DIAGNOSIS — E669 Obesity, unspecified: Secondary | ICD-10-CM | POA: Diagnosis not present

## 2016-09-13 LAB — BASIC METABOLIC PANEL
ANION GAP: 6 (ref 5–15)
BUN: 9 mg/dL (ref 6–20)
CHLORIDE: 106 mmol/L (ref 101–111)
CO2: 27 mmol/L (ref 22–32)
Calcium: 9.5 mg/dL (ref 8.9–10.3)
Creatinine, Ser: 0.81 mg/dL (ref 0.44–1.00)
GFR calc Af Amer: >60 mL/min (ref >60)
GLUCOSE: 97 mg/dL (ref 65–99)
Potassium: 3.9 mmol/L (ref 3.5–5.1)
SODIUM: 139 mmol/L (ref 135–145)

## 2016-09-17 NOTE — H&P (Addendum)
PREOPERATIVE H&P  Chief Complaint: right knee pain  HPI: Lori [name] is a 65 y.o. female who presents for evaluation of right knee pain. It has been present for greater than 3 months and has been worsening. She has failed conservative measures. Pain is rated as moderate.  Past Medical History:  Diagnosis Date  . Arthritis    knees, shoulders  . Cataract    surgery  . Hypertension   . Obesity   . Pre-diabetes    Past Surgical History:  Procedure Laterality Date  . ABDOMINAL HYSTERECTOMY    . BLADDER SUSPENSION    . CATARACT EXTRACTION, BILATERAL    . TUBAL LIGATION    . UTERINE FIBROID SURGERY     Social History   Social History  . Marital status: Married    Spouse name: N/A  . Number of children: N/A  . Years of education: N/A   Social History Main Topics  . Smoking status: Never Smoker  . Smokeless tobacco: Never Used  . Alcohol use No  . Drug use: No  . Sexual activity: Not Asked   Other Topics Concern  . None   Social History Narrative  . None   Family History  Problem Relation Age of Onset  . Hypertension Other   . Hyperlipidemia Other   . Heart disease Other   . Diabetes Other   . Obesity Other   . Colon cancer Neg Hx    No Known Allergies Prior to Admission medications   Medication Sig Start Date End Date Taking? Authorizing Provider  aspirin EC 81 MG tablet Take 81 mg by mouth daily.   Yes Historical Provider, MD  calcium carbonate (OS-CAL) 600 MG TABS tablet Take 600 mg by mouth daily.    Yes Historical Provider, MD  estradiol (ESTRACE) 0.1 MG/GM vaginal cream Place 2 g vaginally 2 (two) times a week.    Yes Historical Provider, MD  estradiol (VIVELLE-DOT) 0.1 MG/24HR Place 1 patch onto the skin 2 (two) times a week.   Yes Historical Provider, MD  lisinopril-hydrochlorothiazide (PRINZIDE,ZESTORETIC) 20-12.5 MG per tablet Take 1 tablet by mouth daily.   Yes Historical Provider, MD  MULTIPLE VITAMIN PO Take 1 tablet by mouth daily.    Yes  Historical Provider, MD  Naproxen Sodium (ALEVE PO) Take 1 capsule by mouth daily as needed. For joint pain.   Yes Historical Provider, MD  Casanthranol-Docusate Sodium 30-100 MG CAPS Take 1 capsule by mouth daily as needed. For constipation.    Historical Provider, MD  indomethacin (INDOCIN) 50 MG capsule Take 1 capsule (50 mg total) by mouth 2 (two) times daily with a meal. 05/29/16   Tharon AquasFrank C Patrick, PA     Positive ROS: none  All other systems have been reviewed and were otherwise negative with the exception of those mentioned in the HPI and as above.  Physical Exam: There were no vitals filed for this visit.  General: Alert, no acute distress Cardiovascular: No pedal edema Respiratory: No cyanosis, no use of accessory musculature GI: No organomegaly, abdomen is soft and non-tender Skin: No lesions in the area of chief complaint Neurologic: Sensation intact distally Psychiatric: Patient is competent for consent with normal mood and affect Lymphatic: No axillary or cervical lymphadenopathy  MUSCULOSKELETAL: right knee: Tender to palpation over the medial joint line.  Positive McMurray.  Pain to range of motion.  No instability.  MRI: MRI shows significant medial meniscal tear  Assessment/Plan: RIGHT KNEE MEDIAL MENISCUS TEAR Plan for Procedure(s):  KNEE ARTHROSCOPY WITH MENISCAL debridement  The risks benefits and alternatives were discussed with the patient including but not limited to the risks of nonoperative treatment, versus surgical intervention including infection, bleeding, nerve injury, malunion, nonunion, hardware prominence, hardware failure, need for hardware removal, blood clots, cardiopulmonary complications, morbidity, mortality, among others, and they were willing to proceed.  Predicted outcome is good, although there will be at least a six to nine month expected recovery.  Karley Pho L, MD 09/17/2016 9:50 PM No change overnight

## 2016-09-18 ENCOUNTER — Encounter (HOSPITAL_BASED_OUTPATIENT_CLINIC_OR_DEPARTMENT_OTHER)
Admission: RE | Disposition: A | Discharge status: Home / Self Care | Payer: Self-pay | Source: Ambulatory Visit | Attending: Orthopedic Surgery

## 2016-09-18 ENCOUNTER — Encounter (HOSPITAL_BASED_OUTPATIENT_CLINIC_OR_DEPARTMENT_OTHER): Payer: Self-pay | Admitting: *Deleted

## 2016-09-18 ENCOUNTER — Ambulatory Visit (HOSPITAL_BASED_OUTPATIENT_CLINIC_OR_DEPARTMENT_OTHER)
Admission: RE | Admit: 2016-09-18 | Discharge: 2016-09-18 | Disposition: A | Discharge status: Home / Self Care | Payer: BC Managed Care – PPO | Source: Ambulatory Visit | Attending: Orthopedic Surgery | Admitting: Orthopedic Surgery

## 2016-09-18 ENCOUNTER — Ambulatory Visit (HOSPITAL_BASED_OUTPATIENT_CLINIC_OR_DEPARTMENT_OTHER): Payer: BC Managed Care – PPO | Admitting: Certified Registered"

## 2016-09-18 DIAGNOSIS — R7303 Prediabetes: Secondary | ICD-10-CM | POA: Insufficient documentation

## 2016-09-18 DIAGNOSIS — M2241 Chondromalacia patellae, right knee: Secondary | ICD-10-CM | POA: Insufficient documentation

## 2016-09-18 DIAGNOSIS — M23321 Other meniscus derangements, posterior horn of medial meniscus, right knee: Secondary | ICD-10-CM | POA: Insufficient documentation

## 2016-09-18 DIAGNOSIS — E669 Obesity, unspecified: Secondary | ICD-10-CM | POA: Insufficient documentation

## 2016-09-18 DIAGNOSIS — Z7982 Long term (current) use of aspirin: Secondary | ICD-10-CM | POA: Insufficient documentation

## 2016-09-18 DIAGNOSIS — M23361 Other meniscus derangements, other lateral meniscus, right knee: Secondary | ICD-10-CM | POA: Insufficient documentation

## 2016-09-18 DIAGNOSIS — S83241A Other tear of medial meniscus, current injury, right knee, initial encounter: Secondary | ICD-10-CM

## 2016-09-18 DIAGNOSIS — I10 Essential (primary) hypertension: Secondary | ICD-10-CM | POA: Insufficient documentation

## 2016-09-18 DIAGNOSIS — Z79899 Other long term (current) drug therapy: Secondary | ICD-10-CM | POA: Insufficient documentation

## 2016-09-18 DIAGNOSIS — Z6834 Body mass index (BMI) 34.0-34.9, adult: Secondary | ICD-10-CM | POA: Insufficient documentation

## 2016-09-18 HISTORY — DX: Unspecified osteoarthritis, unspecified site: M19.90

## 2016-09-18 HISTORY — PX: KNEE ARTHROSCOPY WITH MEDIAL MENISECTOMY: SHX5651

## 2016-09-18 HISTORY — DX: Prediabetes: R73.03

## 2016-09-18 SURGERY — ARTHROSCOPY, KNEE, WITH MEDIAL MENISCECTOMY
Anesthesia: General | Site: Knee | Laterality: Right

## 2016-09-18 MED ORDER — MEPERIDINE HCL 25 MG/ML IJ SOLN: 6.2500 mg | INTRAMUSCULAR | Status: DC | PRN

## 2016-09-18 MED ORDER — OXYCODONE HCL 5 MG PO TABS
ORAL_TABLET | ORAL | Status: AC
  Filled 2016-09-18: qty 1

## 2016-09-18 MED ORDER — HYDROMORPHONE HCL 1 MG/ML IJ SOLN: 0.2500 mg | INTRAMUSCULAR | Status: DC | PRN

## 2016-09-18 MED ORDER — ONDANSETRON HCL 4 MG/2ML IJ SOLN
INTRAMUSCULAR | Status: AC
  Filled 2016-09-18: qty 2

## 2016-09-18 MED ORDER — BUPIVACAINE HCL (PF) 0.5 % IJ SOLN
INTRAMUSCULAR | Status: AC
  Filled 2016-09-18: qty 30

## 2016-09-18 MED ORDER — LIDOCAINE HCL (CARDIAC) 20 MG/ML IV SOLN
INTRAVENOUS | Status: DC | PRN
  Administered 2016-09-18: 60 mg via INTRAVENOUS

## 2016-09-18 MED ORDER — PROPOFOL 500 MG/50ML IV EMUL
INTRAVENOUS | Status: AC
  Filled 2016-09-18: qty 50

## 2016-09-18 MED ORDER — BUPIVACAINE HCL (PF) 0.25 % IJ SOLN
INTRAMUSCULAR | Status: AC
  Filled 2016-09-18: qty 30

## 2016-09-18 MED ORDER — OXYCODONE HCL 5 MG PO TABS
5.0000 mg | ORAL_TABLET | Freq: Once | ORAL | Status: AC
  Administered 2016-09-18: 5 mg via ORAL

## 2016-09-18 MED ORDER — CEFAZOLIN SODIUM-DEXTROSE 2-4 GM/100ML-% IV SOLN
INTRAVENOUS | Status: AC
  Filled 2016-09-18: qty 100

## 2016-09-18 MED ORDER — SCOPOLAMINE 1 MG/3DAYS TD PT72: 1.0000 | MEDICATED_PATCH | Freq: Once | TRANSDERMAL | Status: DC | PRN

## 2016-09-18 MED ORDER — ONDANSETRON HCL 4 MG/2ML IJ SOLN: 4.0000 mg | Freq: Once | INTRAMUSCULAR | Status: DC | PRN

## 2016-09-18 MED ORDER — CEFAZOLIN SODIUM-DEXTROSE 2-4 GM/100ML-% IV SOLN
2.0000 g | INTRAVENOUS | Status: AC
  Administered 2016-09-18: 2 g via INTRAVENOUS

## 2016-09-18 MED ORDER — FENTANYL CITRATE (PF) 100 MCG/2ML IJ SOLN
50.0000 ug | INTRAMUSCULAR | Status: AC | PRN
  Administered 2016-09-18 (×2): 25 ug via INTRAVENOUS
  Administered 2016-09-18 (×2): 50 ug via INTRAVENOUS

## 2016-09-18 MED ORDER — ONDANSETRON HCL 4 MG/2ML IJ SOLN
INTRAMUSCULAR | Status: DC | PRN
  Administered 2016-09-18: 4 mg via INTRAVENOUS

## 2016-09-18 MED ORDER — OXYCODONE-ACETAMINOPHEN 5-325 MG PO TABS: 1.0000 | ORAL_TABLET | Freq: Four times a day (QID) | ORAL | 0 refills | Status: DC | PRN

## 2016-09-18 MED ORDER — PROPOFOL 10 MG/ML IV BOLUS
INTRAVENOUS | Status: DC | PRN
  Administered 2016-09-18: 150 mg via INTRAVENOUS

## 2016-09-18 MED ORDER — EPINEPHRINE 30 MG/30ML IJ SOLN
INTRAMUSCULAR | Status: AC
  Filled 2016-09-18: qty 1

## 2016-09-18 MED ORDER — FENTANYL CITRATE (PF) 100 MCG/2ML IJ SOLN
INTRAMUSCULAR | Status: AC
  Filled 2016-09-18: qty 2

## 2016-09-18 MED ORDER — MIDAZOLAM HCL 2 MG/2ML IJ SOLN
INTRAMUSCULAR | Status: AC
  Filled 2016-09-18: qty 2

## 2016-09-18 MED ORDER — DEXAMETHASONE SODIUM PHOSPHATE 10 MG/ML IJ SOLN
INTRAMUSCULAR | Status: DC | PRN
  Administered 2016-09-18: 4 mg via INTRAVENOUS

## 2016-09-18 MED ORDER — LIDOCAINE 2% (20 MG/ML) 5 ML SYRINGE
INTRAMUSCULAR | Status: AC
  Filled 2016-09-18: qty 5

## 2016-09-18 MED ORDER — DEXAMETHASONE SODIUM PHOSPHATE 10 MG/ML IJ SOLN
INTRAMUSCULAR | Status: AC
  Filled 2016-09-18: qty 1

## 2016-09-18 MED ORDER — SODIUM CHLORIDE 0.9 % IR SOLN
Status: DC | PRN
  Administered 2016-09-18: 3000 mL

## 2016-09-18 MED ORDER — CHLORHEXIDINE GLUCONATE 4 % EX LIQD: 60.0000 mL | Freq: Once | CUTANEOUS | Status: DC

## 2016-09-18 MED ORDER — BUPIVACAINE HCL (PF) 0.5 % IJ SOLN
INTRAMUSCULAR | Status: DC | PRN
  Administered 2016-09-18: 20 mL

## 2016-09-18 MED ORDER — MIDAZOLAM HCL 2 MG/2ML IJ SOLN
1.0000 mg | INTRAMUSCULAR | Status: DC | PRN
  Administered 2016-09-18: 2 mg via INTRAVENOUS

## 2016-09-18 MED ORDER — LACTATED RINGERS IV SOLN
INTRAVENOUS | Status: DC
  Administered 2016-09-18: 07:00:00 via INTRAVENOUS

## 2016-09-18 SURGICAL SUPPLY — 38 items
BANDAGE ACE 6X5 VEL STRL LF (GAUZE/BANDAGES/DRESSINGS) ×4 IMPLANT
BLADE 4.2CUDA (BLADE) IMPLANT
BLADE GREAT WHITE 4.2 (BLADE) ×3 IMPLANT
BLADE GREAT WHITE 4.2MM (BLADE) ×1
CUTTER MENISCUS  4.2MM (BLADE)
CUTTER MENISCUS 4.2MM (BLADE) IMPLANT
DRAPE ARTHROSCOPY W/POUCH 114 (DRAPES) ×4 IMPLANT
DRSG EMULSION OIL 3X3 NADH (GAUZE/BANDAGES/DRESSINGS) ×4 IMPLANT
DURAPREP 26ML APPLICATOR (WOUND CARE) ×4 IMPLANT
ELECT MENISCUS 165MM 90D (ELECTRODE) IMPLANT
ELECT REM PT RETURN 9FT ADLT (ELECTROSURGICAL)
ELECTRODE REM PT RTRN 9FT ADLT (ELECTROSURGICAL) IMPLANT
GAUZE SPONGE 4X4 12PLY STRL (GAUZE/BANDAGES/DRESSINGS) ×4 IMPLANT
GLOVE BIOGEL PI IND STRL 8 (GLOVE) ×4 IMPLANT
GLOVE BIOGEL PI INDICATOR 8 (GLOVE) ×4
GLOVE ECLIPSE 7.5 STRL STRAW (GLOVE) ×8 IMPLANT
GOWN STRL REUS W/ TWL LRG LVL3 (GOWN DISPOSABLE) ×2 IMPLANT
GOWN STRL REUS W/ TWL XL LVL3 (GOWN DISPOSABLE) ×2 IMPLANT
GOWN STRL REUS W/TWL LRG LVL3 (GOWN DISPOSABLE) ×4
GOWN STRL REUS W/TWL XL LVL3 (GOWN DISPOSABLE) ×8 IMPLANT
HOLDER KNEE FOAM BLUE (MISCELLANEOUS) ×4 IMPLANT
IV NS IRRIG 3000ML ARTHROMATIC (IV SOLUTION) ×8 IMPLANT
KNEE WRAP E Z 3 GEL PACK (MISCELLANEOUS) ×4 IMPLANT
MANIFOLD NEPTUNE II (INSTRUMENTS) IMPLANT
NDL SAFETY ECLIPSE 18X1.5 (NEEDLE) IMPLANT
NEEDLE HYPO 18GX1.5 SHARP (NEEDLE)
PACK ARTHROSCOPY DSU (CUSTOM PROCEDURE TRAY) ×4 IMPLANT
PACK BASIN DAY SURGERY FS (CUSTOM PROCEDURE TRAY) ×4 IMPLANT
PAD CAST 4YDX4 CTTN HI CHSV (CAST SUPPLIES) ×2 IMPLANT
PADDING CAST COTTON 4X4 STRL (CAST SUPPLIES) ×4
PENCIL BUTTON HOLSTER BLD 10FT (ELECTRODE) IMPLANT
SET ARTHROSCOPY TUBING (MISCELLANEOUS) ×4
SET ARTHROSCOPY TUBING LN (MISCELLANEOUS) ×2 IMPLANT
SUT ETHILON 4 0 PS 2 18 (SUTURE) IMPLANT
SYR 5ML LL (SYRINGE) ×4 IMPLANT
TOWEL OR 17X24 6PK STRL BLUE (TOWEL DISPOSABLE) ×4 IMPLANT
TOWEL OR NON WOVEN STRL DISP B (DISPOSABLE) ×4 IMPLANT
WATER STERILE IRR 1000ML POUR (IV SOLUTION) ×4 IMPLANT

## 2016-09-18 NOTE — Brief Op Note (Signed)
09/18/2016  1:31 PM  PATIENT:  Lori [name]  65 y.o. female  PRE-OPERATIVE DIAGNOSIS:  RIGHT KNEE MEDIAL MENISCUS TEAR  POST-OPERATIVE DIAGNOSIS:  RIGHT KNEE MEDIAL MENISCUS TEAR  PROCEDURE:  Procedure(s) with comments: KNEE ARTHROSCOPY (Right) - KNEE ARTHROSCOPY , MEDIAL CHONDROPLASTY, MEDIAL LATERAL MENISCAL TEAR, CHONDROPLASTY OF THE PATELLA  SURGEON:  Surgeon(s) and Role:    * Jodi GeraldsJohn Pastor Sgro, MD - Primary  PHYSICIAN ASSISTANT:   ASSISTANTS: bethune   ANESTHESIA:   general  EBL:  Total I/O In: 1144 [P.O.:444; I.V.:700] Out: -   BLOOD ADMINISTERED:none  DRAINS: none   LOCAL MEDICATIONS USED:  MARCAINE     SPECIMEN:  No Specimen  DISPOSITION OF SPECIMEN:  N/A  COUNTS:  YES  TOURNIQUET:  * No tourniquets in log *  DICTATION: .Other Dictation: Dictation Number [id number] missed last 3  PLAN OF CARE: Discharge to home after PACU  PATIENT DISPOSITION:  PACU - hemodynamically stable.   Delay start of Pharmacological VTE agent (>24hrs) due to surgical blood loss or risk of bleeding: no

## 2016-09-18 NOTE — Transfer of Care (Signed)
Immediate Anesthesia Transfer of Care Note  Patient: Lori SchmidHazel W [name]  Procedure(s) Performed: Procedure(s) with comments: KNEE ARTHROSCOPY (Right) - KNEE ARTHROSCOPY , MEDIAL CHONDROPLASTY, MEDIAL LATERAL MENISCAL TEAR, CHONDROPLASTY OF THE PATELLA  Patient Location: PACU  Anesthesia Type:General  Level of Consciousness: awake and patient cooperative  Airway & Oxygen Therapy: Patient Spontanous Breathing and Patient connected to face mask oxygen  Post-op Assessment: Report given to RN and Post -op Vital signs reviewed and stable  Post vital signs: Reviewed and stable  Last Vitals:  Vitals:   09/18/16 0948 09/18/16 0949  BP: 102/65   Pulse: 84 84  Resp: (!) 6 13  Temp:      Last Pain:  Vitals:   09/18/16 0709  TempSrc: Oral  PainSc: 3       Patients Stated Pain Goal: 1 (09/18/16 0709)  Complications: No apparent anesthesia complications

## 2016-09-18 NOTE — Anesthesia Procedure Notes (Signed)
Procedure Name: LMA Insertion Date/Time: 09/18/2016 9:10 AM Performed by: Loise Esguerra D Pre-anesthesia Checklist: Patient identified, Emergency Drugs available, Suction available and Patient being monitored Patient Re-evaluated:Patient Re-evaluated prior to inductionOxygen Delivery Method: Circle system utilized Preoxygenation: Pre-oxygenation with 100% oxygen Intubation Type: IV induction Ventilation: Mask ventilation without difficulty LMA: LMA inserted LMA Size: 4.0 Number of attempts: 1 Airway Equipment and Method: Bite block Placement Confirmation: positive ETCO2 Tube secured with: Tape Dental Injury: Teeth and Oropharynx as per pre-operative assessment

## 2016-09-18 NOTE — Anesthesia Preprocedure Evaluation (Addendum)
Anesthesia Evaluation  Patient identified by MRN, date of birth, ID band Patient awake    Reviewed: Allergy & Precautions, NPO status , Patient's Chart, lab work & pertinent test results  Airway Mallampati: I  TM Distance: >3 FB Neck ROM: Full    Dental   Pulmonary    Pulmonary exam normal        Cardiovascular hypertension, Pt. on medications Normal cardiovascular exam     Neuro/Psych    GI/Hepatic   Endo/Other    Renal/GU      Musculoskeletal  (+) Arthritis ,   Abdominal   Peds  Hematology   Anesthesia Other Findings   Reproductive/Obstetrics                            Anesthesia Physical Anesthesia Plan  ASA: II  Anesthesia Plan: General   Post-op Pain Management:    Induction: Intravenous  Airway Management Planned: LMA  Additional Equipment:   Intra-op Plan:   Post-operative Plan: Extubation in OR  Informed Consent: I have reviewed the patients History and Physical, chart, labs and discussed the procedure including the risks, benefits and alternatives for the proposed anesthesia with the patient or authorized representative who has indicated his/her understanding and acceptance.     Plan Discussed with: CRNA and Surgeon  Anesthesia Plan Comments:         Anesthesia Quick Evaluation

## 2016-09-18 NOTE — Discharge Instructions (Signed)
Post Anesthesia Home Care Instructions  Activity: Get plenty of rest for the remainder of the day. A responsible adult should stay with you for 24 hours following the procedure.  For the next 24 hours, DO NOT: -Drive a car -Advertising copywriterperate machinery -Drink alcoholic beverages -Take any medication unless instructed by your physician -Make any legal decisions or sign important papers.  Meals: Start with liquid foods such as gelatin or soup. Progress to regular foods as tolerated. Avoid greasy, spicy, heavy foods. If nausea and/or vomiting occur, drink only clear liquids until the nausea and/or vomiting subsides. Call your physician if vomiting continues.  Special Instructions/Symptoms: Your throat may feel dry or sore from the anesthesia or the breathing tube placed in your throat during surgery. If this causes discomfort, gargle with warm salt water. The discomfort should disappear within 24 hours.  If you had a scopolamine patch placed behind your ear for the management of post- operative nausea and/or vomiting:  1. The medication in the patch is effective for 72 hours, after which it should be removed.  Wrap patch in a tissue and discard in the trash. Wash hands thoroughly with soap and water. 2. You may remove the patch earlier than 72 hours if you experience unpleasant side effects which may include dry mouth, dizziness or visual disturbances. 3. Avoid touching the patch. Wash your hands with soap and water after contact with the patch.   POST-OP KNEE ARTHROSCOPY INSTRUCTIONS  Dr. Cherlyn RobertsJohn Graves/Jim Bethune PA-C  Pain You will be expected to have a moderate amount of pain in the affected knee for approximately two weeks. However, the first two days will be the most severe pain. A prescription has been provided to take as needed for the pain. The pain can be reduced by applying ice packs to the knee for the first 1-2 weeks post surgery. Also, keeping the leg elevated on pillows will help  alleviate the pain. If you develop any acute pain or swelling in your calf muscle, please call the doctor.  Activity It is preferred that you stay at bed rest for approximately 24 hours. However, you may go to the bathroom with help. Weight bearing as tolerated. You may begin the knee exercises the day of surgery. Discontinue crutches as the knee pain resolves.  Dressing Keep the dressing dry. If the ace bandage should wrinkle or roll up, this can be rewrapped to prevent ridges in the bandage. You may remove all dressings in 48 hours,  apply bandaids to each wound. You may shower on the 4th day after surgery but no tub bath.  Symptoms to report to your doctor Extreme pain Extreme swelling Temperature above 101 degrees Change in the feeling, color, or movement of your toes Redness, heat, or swelling at your incision  Exercise If is preferred that as soon as possible you try to do a straight leg raise without bending the knee and concentrate on bringing the heel of your foot off the bed up to approximately 45 degrees and hold for the count of 10 seconds. Repeat this at least 10 times three or four times per day. Additional exercises are provided below.  You are encouraged to bend the knee as tolerated.  Follow-Up Call to schedule a follow-up appointment in 5-7 days. Our office # is [phone].  POST-OP EXERCISES  Short Arc Quads  1. Lie on back with legs straight. Place towel roll under thigh, just above knee. 2. Tighten thigh muscles to straighten knee and lift heel off bed.  3. Hold for slow count of five, then lower. 4. Do three sets of ten    Straight Leg Raises  1. Lie on back with operative leg straight and other leg bent. 2. Keeping operative leg completely straight, slowly lift operative leg so foot is 5 inches off bed. 3. Hold for slow count of five, then lower. 4. Do three sets of ten.   Post Anesthesia Home Care Instructions  Activity: Get plenty of rest for the  remainder of the day. A responsible adult should stay with you for 24 hours following the procedure.  For the next 24 hours, DO NOT: -Drive a car -Advertising copywriterperate machinery -Drink alcoholic beverages -Take any medication unless instructed by your physician -Make any legal decisions or sign important papers.  Meals: Start with liquid foods such as gelatin or soup. Progress to regular foods as tolerated. Avoid greasy, spicy, heavy foods. If nausea and/or vomiting occur, drink only clear liquids until the nausea and/or vomiting subsides. Call your physician if vomiting continues.  Special Instructions/Symptoms: Your throat may feel dry or sore from the anesthesia or the breathing tube placed in your throat during surgery. If this causes discomfort, gargle with warm salt water. The discomfort should disappear within 24 hours.  If you had a scopolamine patch placed behind your ear for the management of post- operative nausea and/or vomiting:  1. The medication in the patch is effective for 72 hours, after which it should be removed.  Wrap patch in a tissue and discard in the trash. Wash hands thoroughly with soap and water. 2. You may remove the patch earlier than 72 hours if you experience unpleasant side effects which may include dry mouth, dizziness or visual disturbances. 3. Avoid touching the patch. Wash your hands with soap and water after contact with the patch.     DO BOTH EXERCISES 2 TIMES A DAY  Ankle Pumps  Work/move the operative ankle and foot up and down 10 times every hour while awake.

## 2016-09-18 NOTE — Anesthesia Postprocedure Evaluation (Signed)
Anesthesia Post Note  Patient: Lori [name]  Procedure(s) Performed: Procedure(s) (LRB): KNEE ARTHROSCOPY (Right)  Patient location during evaluation: PACU Anesthesia Type: General Level of consciousness: awake and alert Pain management: pain level controlled Vital Signs Assessment: post-procedure vital signs reviewed and stable Respiratory status: spontaneous breathing, nonlabored ventilation, respiratory function stable and patient connected to nasal cannula oxygen Cardiovascular status: blood pressure returned to baseline and stable Postop Assessment: no signs of nausea or vomiting Anesthetic complications: no       Last Vitals:  Vitals:   09/18/16 1000 09/18/16 1038  BP: 121/82 127/72  Pulse: 89 72  Resp: (!) 21 18  Temp:  36.4 C    Last Pain:  Vitals:   09/18/16 1038  TempSrc:   PainSc: 4                  Marcanthony Sleight DAVID

## 2016-09-19 ENCOUNTER — Encounter (HOSPITAL_BASED_OUTPATIENT_CLINIC_OR_DEPARTMENT_OTHER): Payer: Self-pay | Admitting: Orthopedic Surgery

## 2016-09-19 NOTE — Op Note (Signed)
NAME:  Lori [name], Lori [name]                ACCOUNT NO.:  [id number]  MEDICAL RECORD NO.:  [id number]  LOCATION:                                 FACILITY:  PHYSICIAN:  Harvie JuniorJohn L. Maryam Feely, M.D.   DATE OF BIRTH:  June 29, 1951  DATE OF PROCEDURE:  09/18/2016 DATE OF DISCHARGE:  09/18/2016                              OPERATIVE REPORT   PREOPERATIVE DIAGNOSIS:  Medial meniscal tear, right leg.  POSTOPERATIVE DIAGNOSES: 1. Medial meniscal tear, right leg. 2. 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  PROCEDURES: 1. Partial medial and partial lateral meniscectomies. 2. Chondroplasty of medial femoral condyle. 3. Chondroplasty of patellofemoral joint.  SURGEON:  Harvie JuniorJohn L. Khristian Seals, M.D.  ASSISTANT:  Marshia LyJames Bethune, PA-C.  BRIEF HISTORY:  [name] is a 65 year old female with long history of significant complaints of right knee pain.  She had been treated conservatively for prolonged period of time and failed conservative care and after failure of all conservative care, she was taken to the operating room for right knee arthroscopy.  The patient had preoperative MRI, which showed that she had complex horn, medial meniscal tear and after failure of all conservative care, she was taken to the operating room for this procedure.  DESCRIPTION OF PROCEDURE:  The patient was taken to the operating room. After adequate anesthesia was obtained with general anesthetic, the patient was placed supine on the operating table.  The right leg was prepped and draped in usual sterile fashion.  Following this, routine arthroscopic examination of the knee revealed that there was obvious chondromalacia of the patellofemoral joint, which was debrided back to a smooth and stable rim.  Attention was turned in the medial compartment where after evaluation, there was a complex posterior horn of medial meniscal tear, which was debrided.  There was significant  chondromalacia of the medial femoral condyle, which was debrided back to a smooth and stable rim of articular cartilage.  Then, meniscus was debrided back to a smooth and stable rim of the meniscus.  Once this was done, attention was turned to the ACL normal.  Attention was turned to the lateral side where the articular cartilage is without abnormality, but there is a small outer edge meniscal tear, which was debrided with a straight biting forceps.  The remaining meniscal rim was contoured down with the suction shaver at this point.  Once this was completed, attention was turned back up to the patellofemoral joint where the final chondromalacia of the patellofemoral joint was debrided as well.  At this point, the knee was copiously and thoroughly lavaged and suctioned dry.  The 20 mL of 0.25% Marcaine was instilled throughout the knee for postoperative pain control.  Sterile compressive dressing was applied, and the patient was taken to the recovery room, was noted to be in satisfactory condition. Estimated blood loss for the procedure in minimal.     Harvie JuniorJohn L. Suzan Manon, M.D.   ______________________________ Harvie JuniorJohn L. Quincie Haroon, M.D.    Ranae PlumberJLG/MEDQ  D:  09/18/2016  T:  09/19/2016  Job:  [id number]

## 2016-09-24 ENCOUNTER — Encounter (HOSPITAL_BASED_OUTPATIENT_CLINIC_OR_DEPARTMENT_OTHER): Payer: Self-pay | Admitting: Orthopedic Surgery

## 2016-09-26 ENCOUNTER — Encounter (HOSPITAL_BASED_OUTPATIENT_CLINIC_OR_DEPARTMENT_OTHER): Payer: Self-pay | Admitting: Orthopedic Surgery

## 2016-10-29 ENCOUNTER — Other Ambulatory Visit: Payer: Self-pay | Admitting: Obstetrics and Gynecology

## 2016-10-29 DIAGNOSIS — R921 Mammographic calcification found on diagnostic imaging of breast: Secondary | ICD-10-CM

## 2016-12-10 ENCOUNTER — Ambulatory Visit
Admission: RE | Admit: 2016-12-10 | Discharge: 2016-12-10 | Disposition: A | Discharge status: Home / Self Care | Payer: BC Managed Care – PPO | Source: Ambulatory Visit | Attending: Obstetrics and Gynecology | Admitting: Obstetrics and Gynecology

## 2016-12-10 DIAGNOSIS — R921 Mammographic calcification found on diagnostic imaging of breast: Secondary | ICD-10-CM

## 2017-04-28 ENCOUNTER — Other Ambulatory Visit: Payer: Self-pay | Admitting: Obstetrics and Gynecology

## 2017-04-28 DIAGNOSIS — R921 Mammographic calcification found on diagnostic imaging of breast: Secondary | ICD-10-CM

## 2017-05-27 ENCOUNTER — Ambulatory Visit
Admission: RE | Admit: 2017-05-27 | Discharge: 2017-05-27 | Disposition: A | Discharge status: Home / Self Care | Payer: BC Managed Care – PPO | Source: Ambulatory Visit | Attending: Obstetrics and Gynecology | Admitting: Obstetrics and Gynecology

## 2017-05-27 DIAGNOSIS — R921 Mammographic calcification found on diagnostic imaging of breast: Secondary | ICD-10-CM

## 2017-10-05 IMAGING — DX DG KNEE COMPLETE 4+V*R*
4 series · 4 of 4 positions shown · non-contrast
Comparison: Radiograph dated 10/08/2013

CLINICAL DATA: 65-year-old female with intermittent right knee
pain.

EXAM:
RIGHT KNEE - COMPLETE 4+ VIEW

[knee ap]
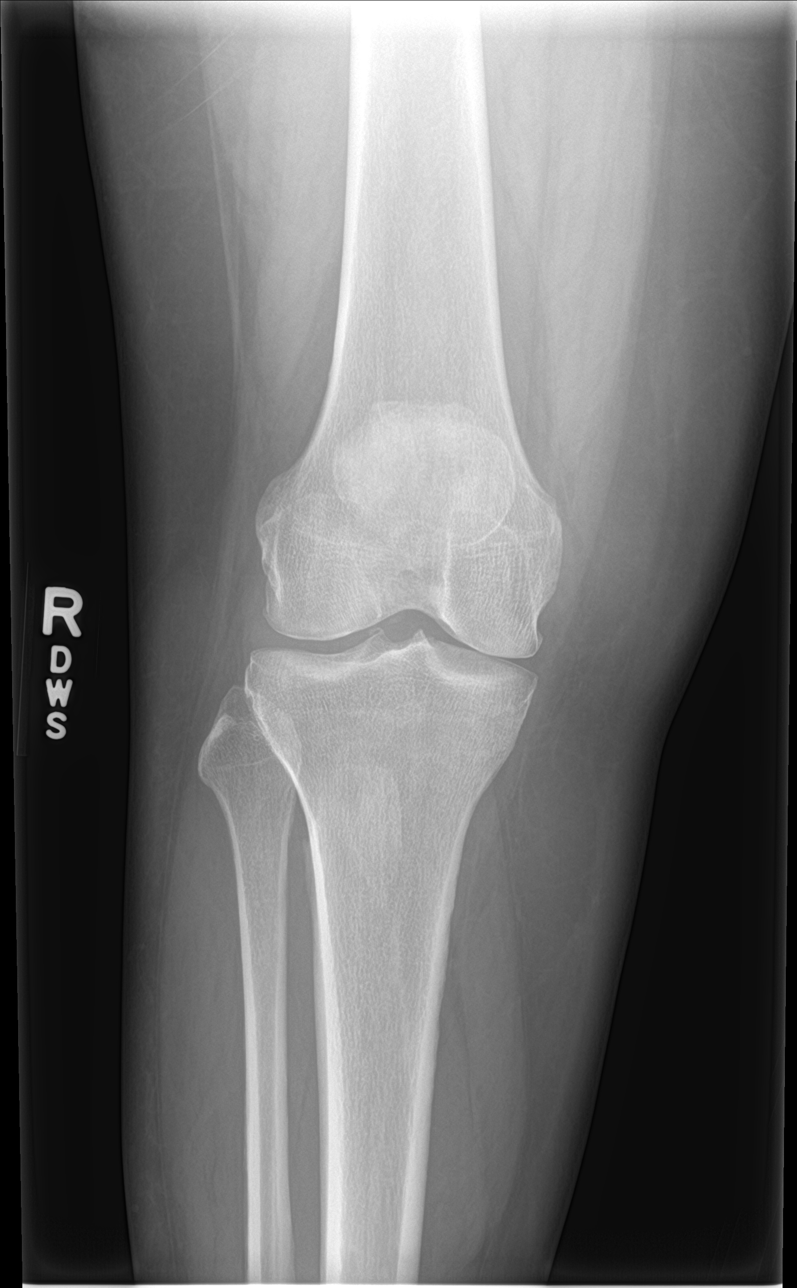

[knee obl (1 of 2)]
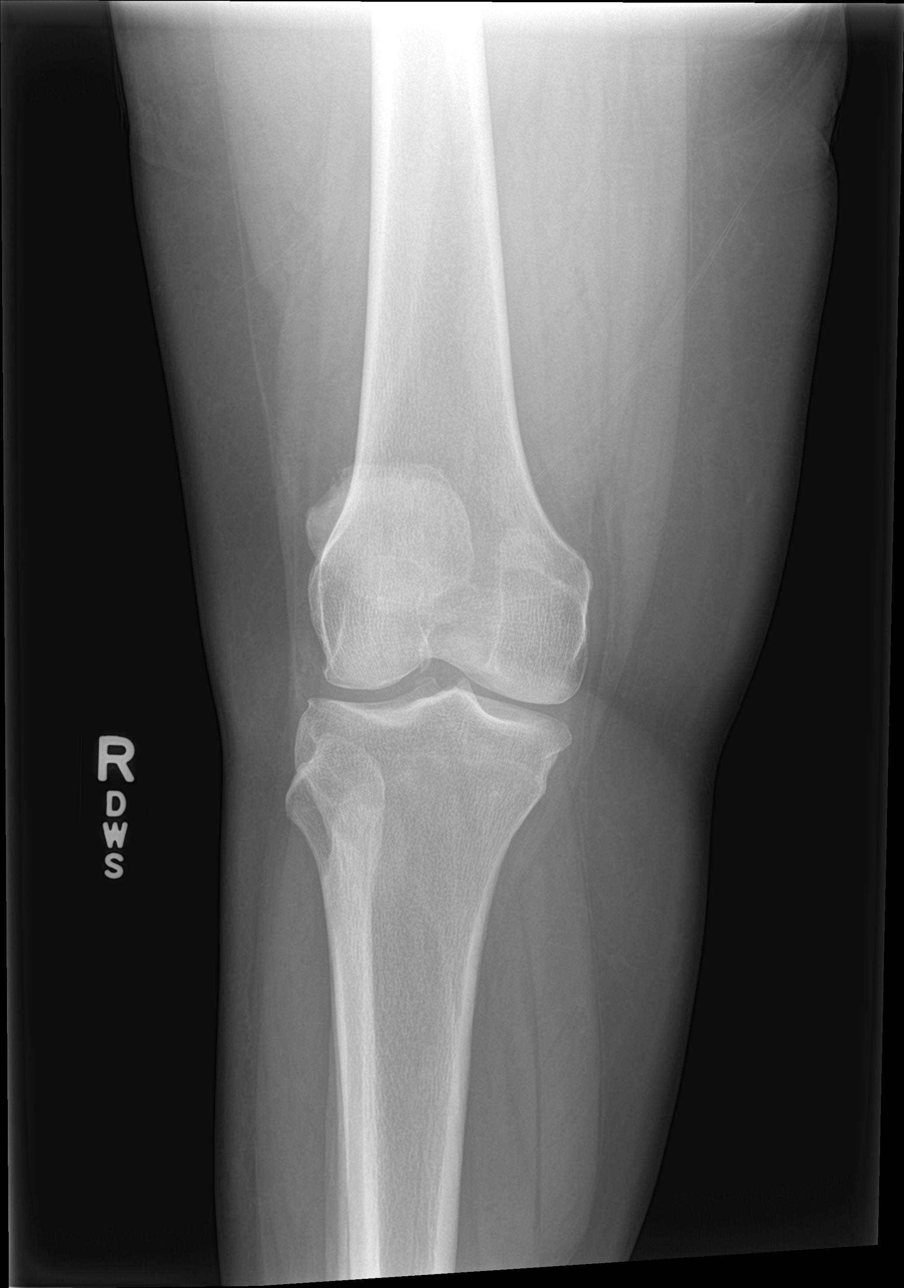

[knee obl (2 of 2)]
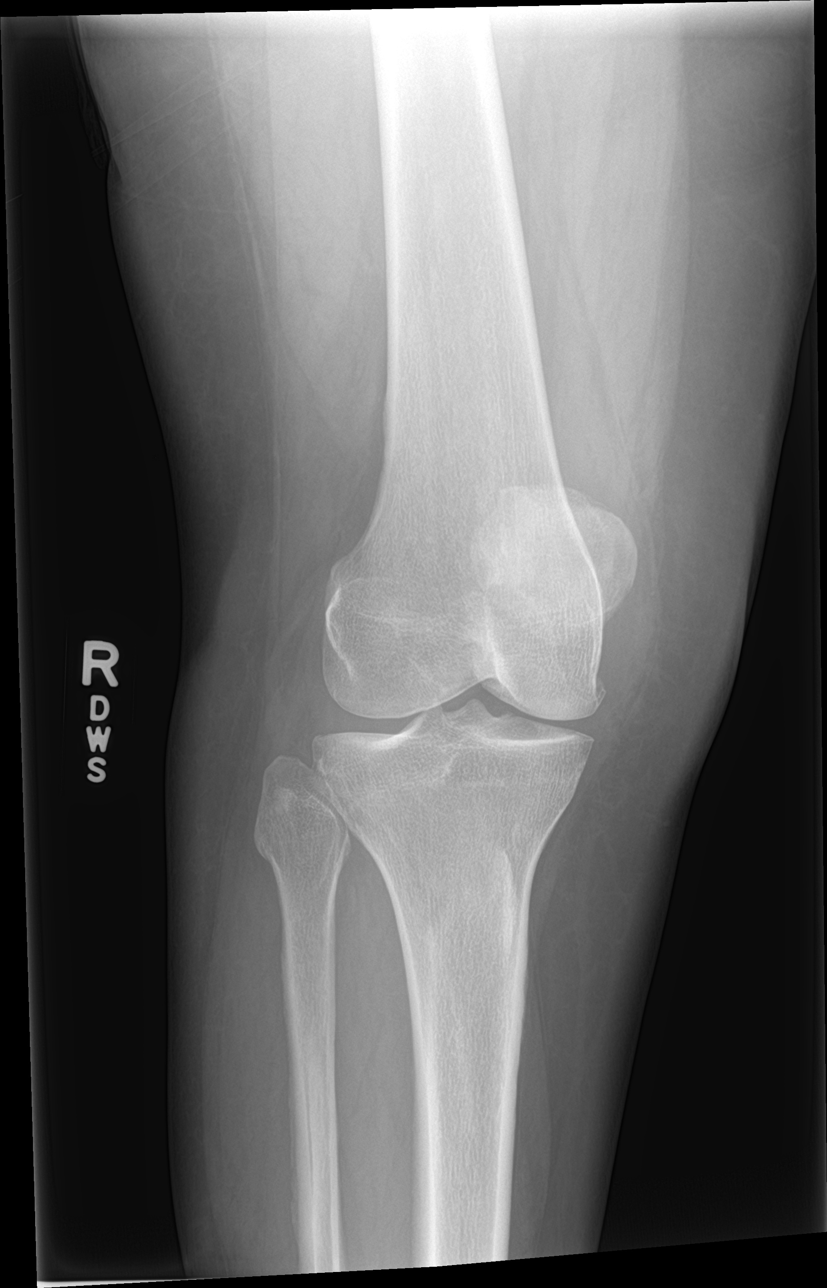

[knee lat]
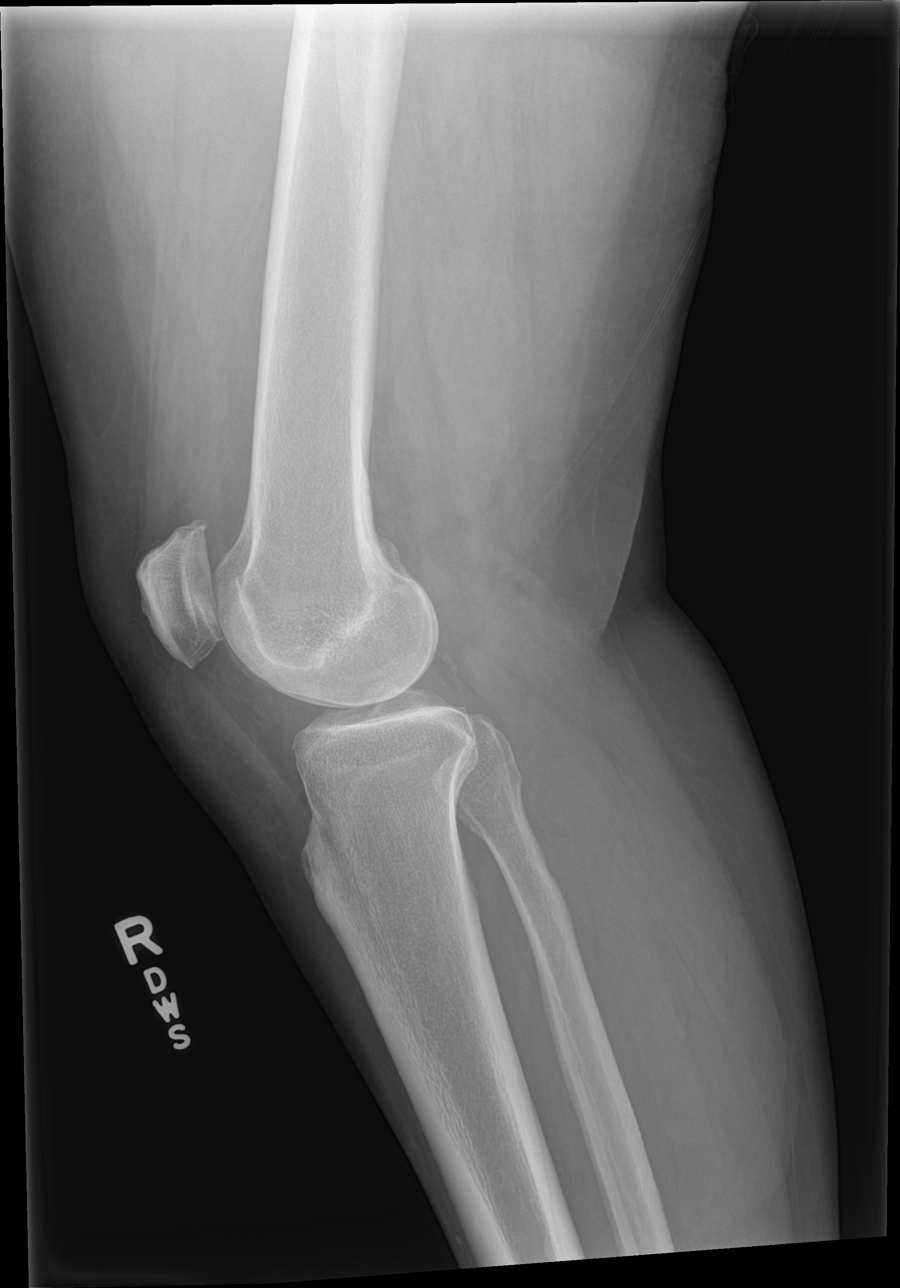

[4 of 4 positions shown; findings below may reference images not displayed]

FINDINGS: There is no acute fracture or dislocation. The bones are well
mineralized. There is mild arthritic changes with bone spurring. All
no significant joint effusion. The soft tissues appear unremarkable.
IMPRESSION: No acute fracture or dislocation.

Mild degenerative changes relatively similar to prior study.

## 2018-03-11 ENCOUNTER — Other Ambulatory Visit: Payer: Self-pay | Admitting: Obstetrics and Gynecology

## 2018-03-11 DIAGNOSIS — R921 Mammographic calcification found on diagnostic imaging of breast: Secondary | ICD-10-CM

## 2018-05-29 ENCOUNTER — Ambulatory Visit
Admission: RE | Admit: 2018-05-29 | Discharge: 2018-05-29 | Disposition: A | Discharge status: Home / Self Care | Payer: Medicare Other | Source: Ambulatory Visit | Attending: Obstetrics and Gynecology | Admitting: Obstetrics and Gynecology

## 2018-05-29 DIAGNOSIS — R921 Mammographic calcification found on diagnostic imaging of breast: Secondary | ICD-10-CM

## 2019-04-13 ENCOUNTER — Other Ambulatory Visit: Payer: Self-pay

## 2019-04-13 DIAGNOSIS — Z20822 Contact with and (suspected) exposure to covid-19: Secondary | ICD-10-CM

## 2019-04-16 LAB — NOVEL CORONAVIRUS, NAA: SARS-CoV-2, NAA: NOT DETECTED

## 2019-04-26 ENCOUNTER — Ambulatory Visit (INDEPENDENT_AMBULATORY_CARE_PROVIDER_SITE_OTHER): Payer: Medicare Other | Admitting: Gastroenterology

## 2019-04-26 ENCOUNTER — Encounter: Payer: Self-pay | Admitting: Gastroenterology

## 2019-04-26 DIAGNOSIS — R1084 Generalized abdominal pain: Secondary | ICD-10-CM | POA: Diagnosis not present

## 2019-04-26 DIAGNOSIS — R194 Change in bowel habit: Secondary | ICD-10-CM | POA: Diagnosis not present

## 2019-04-26 MED ORDER — DICYCLOMINE HCL 20 MG PO TABS: 20.0000 mg | ORAL_TABLET | Freq: Three times a day (TID) | ORAL | 3 refills | Status: DC

## 2019-04-26 NOTE — Patient Instructions (Addendum)
I have recommended some labs and stool tests to screen for colon inflammation. Please come to the lab on the basement level of our building at your convenience. You do not need to fast for these test.   Trial of daily psyllium for stool bulking. This could be Metamucil, Benefiber, and Citrucel.   A trial of dicyclomine 20 mg four times daily may provide some relief of your abdominal pain.  We will need to consider endoscopy if you are not improving.  Let's plan to check in 4-6 weeks or earlier as needed.   Thank you for your patience with me and our technology today! Please stay home, safe, and healthy. I look forward to meeting you in person in the future.

## 2019-04-26 NOTE — Progress Notes (Signed)
TELEHEALTH VISIT  Referring Provider: Maurice Small, MD Primary Care Physician:  Maurice Small, MD   Tele-visit due to COVID-19 pandemic Patient requested visit virtually, consented to the virtual encounter via video enabled telemedicine application (Zoom, converted to phone due to internet connectivity) Contact made at: 14:15 04/26/19 Patient verified by name and date of birth Location of patient: Home Location provider: [facility] Names of persons participating: Me, patient, Tinnie Gens CMA Time spent on telehealth visit: 28 minutes I discussed the limitations of evaluation and management by telemedicine. The patient expressed understanding and agreed to proceed.  Reason for Consultation:  Abdominal pain   IMPRESSION:  Chronic diffuse abdominal pain Change in bowel habits Blood in the stool x 1 in June Patient concern for "leaky gut" Normal colonoscopy 2008 and 2015  Although there is a lot of information on the internet about leaky gut, the hype is ahead of the science.  It may be one of many mechanism underlying functional GI disease such as IBS, but, at this point we do not know what to do with it in clinical practice in terms of diagnosis and treatment. We don't even know it it's an epiphenomenon.  I recommend that we focus on using evidence based treatments to evaluate and treatment current symptoms.   Will screen for thyroid and electrolyte abnormalities that can result in bowel habit changes. Will screen for IBD with ESR, CRP, and fecal calprotectin.   Trial of psyllium for stool bulking and dicyclomine for abdominal cramping. If symptoms do not resolve, will plan endoscopy evaluation, particularly given her episode of blood in the stool.   PLAN: TSH, serum calcium levels (if not performed by her primary care provider) ESR, CRP, Fecal calprotectin Trial of daily psyllium for stool bulking Dicyclomine 20 mg QID ([facility] at [place]) Consider endoscopy if  not improving Return in 4-6 weeks, or earlier as needed   Please see the "Patient Instructions" section for addition details about the plan.  HPI: Lori [name] is a 68 y.o. female referred by Dr. Justin Mend for further evaluation of abdominal pain.  The history is obtained through the patient, review of her electronic health record, and records provided by Dr. Justin Mend. The patient was previously followed by Dr. Deatra Ina for screening colonoscopy. She has hypertension, hyperlipidemia, and osteoarthritis.  Her primary GI concerns include:  Stomach aching and cramps     - similar to menstrual cycles    - lower band-like distribution    - occurs at night and awakes her from sleep    - some improvement if she drink an Activia in the night Irregular bowel movements     - hard stools like pellets    - associated straining, pellets    - will often go two days without a bowel movement.  Blood in the stool    - isolated episode in June    - no further bleeding since that time  Seeing commercials about leaky gut syndrome on TV.  Increased Activia in the diet. Heating pad provides some relief.    Baseline BM are daily, long, firm, formed BM. Does not strain.   Using OTC stool softener. Has tried to increase her daily water intake. This may be providing some relief.   Appetite is good. She lost 10 pounds over the last year.   Endoscopic History: Colonoscopy 05/06/07: diverticulosis Deatra Ina) Colonoscopy 05/10/14: normal Deatra Ina)  No known family history of colon cancer or polyps. No family history of uterine/endometrial cancer, pancreatic  cancer or gastric/stomach cancer.  Past Medical History:  Diagnosis Date  . Arthritis    knees, shoulders  . Cataract    surgery  . Hypertension   . Obesity   . Pre-diabetes     Past Surgical History:  Procedure Laterality Date  . ABDOMINAL HYSTERECTOMY    . BLADDER SUSPENSION    . CATARACT EXTRACTION, BILATERAL    . KNEE ARTHROSCOPY WITH MEDIAL  MENISECTOMY Right 09/18/2016   Procedure: KNEE ARTHROSCOPY WITH MEDIAL MENISCECTOMY, LATERAL MENISCECTOMY, CHONDROPLASTY;  Surgeon: Dorna Leitz, MD;  Location: [place] [facility];  Service: Orthopedics;  Laterality: Right;  KNEE ARTHROSCOPY, MEDIAL CHONDROPLASTY, MEDIAL LATERAL MENISCAL TEAR, MEDIAL MENEISCECTOMY, CHONDROPLASTY OF THE PATELLA  . TUBAL LIGATION    . UTERINE FIBROID SURGERY      Current Outpatient Medications  Medication Sig Dispense Refill  . aspirin EC 81 MG tablet Take 81 mg by mouth daily.    . calcium carbonate (OS-CAL) 600 MG TABS tablet Take 600 mg by mouth daily.     Sarajane Marek Sodium 30-100 MG CAPS Take 1 capsule by mouth daily as needed. For constipation.    [name] estradiol (ESTRACE) 0.1 MG/GM vaginal cream Place 2 g vaginally 2 (two) times a week.     . estradiol (VIVELLE-DOT) 0.1 MG/24HR Place 1 patch onto the skin 2 (two) times a week.    . indomethacin (INDOCIN) 50 MG capsule Take 1 capsule (50 mg total) by mouth 2 (two) times daily with a meal. 30 capsule 1  . lisinopril-hydrochlorothiazide (PRINZIDE,ZESTORETIC) 20-12.5 MG per tablet Take 1 tablet by mouth daily.    . MULTIPLE VITAMIN PO Take 1 tablet by mouth daily.     [name] oxyCODONE-acetaminophen (PERCOCET/ROXICET) 5-325 MG tablet Take 1-2 tablets by mouth every 6 (six) hours as needed for severe pain. 40 tablet 0   No current facility-administered medications for this visit.     Allergies as of 04/26/2019  . (No Known Allergies)    Family History  Problem Relation Age of Onset  . Hypertension Other   . Hyperlipidemia Other   . Heart disease Other   . Diabetes Other   . Obesity Other   . Colon cancer Neg Hx     Social History   Socioeconomic History  . Marital status: Married    Spouse name: Not on file  . Number of children: Not on file  . Years of education: Not on file  . Highest education level: Not on file  Occupational History  . Not on file  Social Needs  . Financial  resource strain: Not on file  . Food insecurity    Worry: Not on file    Inability: Not on file  . Transportation needs    Medical: Not on file    Non-medical: Not on file  Tobacco Use  . Smoking status: Never Smoker  . Smokeless tobacco: Never Used  Substance and Sexual Activity  . Alcohol use: No  . Drug use: No  . Sexual activity: Not on file  Lifestyle  . Physical activity    Days per week: Not on file    Minutes per session: Not on file  . Stress: Not on file  Relationships  . Social Herbalist on phone: Not on file    Gets together: Not on file    Attends religious service: Not on file    Active member of club or organization: Not on file    Attends meetings of clubs  or organizations: Not on file    Relationship status: Not on file  . Intimate partner violence    Fear of current or ex partner: Not on file    Emotionally abused: Not on file    Physically abused: Not on file    Forced sexual activity: Not on file  Other Topics Concern  . Not on file  Social History Narrative  . Not on file    Review of Systems: ALL ROS discussed and all others negative except listed in HPI.  Physical Exam: Complete physical exam not performed due to the limits inherent in a telehealth encounter.  General: Awake, alert, and oriented, and well communicative. In no acute distress.  HEENT: EOMI, non-icteric sclera, NCAT, MMM  Neck: Normal movement of head and neck  Pulm: No labored breathing, speaking in full sentences without conversational dyspnea  Derm: No apparent lesions or bruising in visible field  MS: Moves all visible extremities without noticeable abnormality  Psych: Pleasant, cooperative, normal speech, normal affect and normal insight Neuro: Alert and appropriate   Miyoshi Ligas L. Tarri Glenn, MD, MPH [facility] 04/26/2019, 8:16 AM

## 2019-05-10 ENCOUNTER — Other Ambulatory Visit (INDEPENDENT_AMBULATORY_CARE_PROVIDER_SITE_OTHER): Payer: Medicare Other

## 2019-05-10 DIAGNOSIS — R194 Change in bowel habit: Secondary | ICD-10-CM

## 2019-05-10 DIAGNOSIS — R1084 Generalized abdominal pain: Secondary | ICD-10-CM

## 2019-05-10 LAB — C-REACTIVE PROTEIN: CRP: <1 mg/dL (ref 0.5–20.0)

## 2019-05-10 LAB — TSH: TSH: 0.62 u[IU]/mL (ref 0.35–4.50)

## 2019-05-10 LAB — CALCIUM: Calcium: 9.3 mg/dL (ref 8.4–10.5)

## 2019-05-10 LAB — SEDIMENTATION RATE: Sed Rate: 21 mm/hr (ref 0–30)

## 2019-05-22 LAB — CALPROTECTIN, FECAL: Calprotectin, Fecal: 34 ug/g (ref 0–120)

## 2019-05-25 ENCOUNTER — Telehealth: Payer: Self-pay | Admitting: Emergency Medicine

## 2019-05-25 NOTE — Telephone Encounter (Signed)
Please let the patient know that the results posted this week. I am not sure why there was such a delay. But, the test was completely normal.  Is she feeling any better? I hope so.  If not, we could go ahead and scheduled EGD and colonoscopy. Otherwise, follow-up in the office (or virtually) as previously planned.  Thank you.

## 2019-05-25 NOTE — Telephone Encounter (Signed)
Patient states the medication you gave her has really helped and she is being very careful of what she eats. She states she prefers to have a virtual visit for her follow up on 06/07/2019 and if necessary she will schedule her colonoscopy then.

## 2019-05-25 NOTE — Telephone Encounter (Signed)
Patient called and requested results of fecal cal protectin test.

## 2019-05-25 NOTE — Telephone Encounter (Signed)
I am delighted that she is feeling better. I'll plan to follow-up with her on 06/07/19. Thank you.

## 2019-05-25 NOTE — Telephone Encounter (Signed)
Informed patient labs were normal but left message to get an update on symptoms.

## 2019-06-07 ENCOUNTER — Encounter: Payer: Self-pay | Admitting: Gastroenterology

## 2019-06-07 ENCOUNTER — Ambulatory Visit (INDEPENDENT_AMBULATORY_CARE_PROVIDER_SITE_OTHER): Payer: Medicare Other | Admitting: Gastroenterology

## 2019-06-07 ENCOUNTER — Other Ambulatory Visit: Payer: Self-pay

## 2019-06-07 DIAGNOSIS — R1084 Generalized abdominal pain: Secondary | ICD-10-CM

## 2019-06-07 DIAGNOSIS — R194 Change in bowel habit: Secondary | ICD-10-CM | POA: Diagnosis not present

## 2019-06-07 MED ORDER — NA SULFATE-K SULFATE-MG SULF 17.5-3.13-1.6 GM/177ML PO SOLN: 1.0000 | ORAL | 0 refills | Status: AC

## 2019-06-07 NOTE — Progress Notes (Signed)
TELEHEALTH VISIT  Referring Provider: Maurice Small, MD Primary Care Physician:  Maurice Small, MD   Tele-visit due to COVID-19 pandemic Patient requested visit virtually, consented to the virtual encounter via video enabled telemedicine application (Zoom) Contact made at: 113:32 06/07/19 Patient verified by name and date of birth Location of patient: Home Location provider: [facility] [facility] Names of persons participating: Me, patient Time spent on telehealth visit: 28 minutes I discussed the limitations of evaluation and management by telemedicine. The patient expressed understanding and agreed to proceed.  Chief complaint:  Abdominal pain   IMPRESSION:  Chronic diffuse abdominal pain Change in bowel habits    - 05/10/19: calcium, 9.3, CRP ,1. ESR 21, TSH 0.62, fecal calprotectin 34 Blood in the stool x 1 in June Patient concern for "leaky gut" Normal colonoscopy 2008 and 2015   Again reviewed the patient's concerns about leaky gut. At this point we do not know what to do with it in clinical practice in terms of diagnosis and treatment. We don't even know it it's an epiphenomenon.  I continue to recommend that we focus on using evidence based treatments to evaluate and treatment her current symptoms.   Differential is broad and includes irritable bowel syndrome, missed infection (such as giardia), food intolerance, microscopic colitis, other functional GI disease. Less likely to be IBD given her history.     PLAN: Continue daily psyllium for stool bulking Continue dicyclomine 20 mg QID Colonoscopy and EGD to exclude organic disease Return 2 weeks after endoscopy to further review   Please see the "Patient Instructions" section for addition details about the plan.  HPI: Lori [name] is a 68 y.o. female under evaluation of abdominal pain.  The interval history is obtained through the patient and review of her electronic health record. The patient was previously  followed by Dr. Deatra Ina for screening colonoscopy. She has hypertension, hyperlipidemia, and osteoarthritis.  She overall feeling some better, but, has not experienced full relief. She is curious about the results of her labs and stool studies.   Her primary GI concerns continue to include:  Stomach aching and cramps     - similar to menstrual cycles    - lower band-like distribution    - occurs at night and awakes her from sleep, but less often on dicyclomine    - some improvement if she drinks an Activia in the night Irregular bowel movements     - continues to have hard stools like pellets    - associated straining, pellets    - previously going two days without a bowel movement    - now having one bowel movement with daily psyllium but they remain pellets Blood in the stool    - isolated episode in June    - no further bleeding since then  Baseline BM are daily, long, firm, formed BM. Does not strain.   Using OTC stool softener. Has tried to increase her daily water intake. This may be providing some relief. Using psyllium daily and dicyclomine 20 mg QID.  Appetite is good. She lost 10 pounds over the last year.   Endoscopic History: Colonoscopy 05/06/07: diverticulosis Deatra Ina) Colonoscopy 05/10/14: normal Deatra Ina)  Recent labs: 05/10/19: calcium, 9.3, CRP ,1. ESR 21, TSH 0.62, fecal calprotectin 34  No known family history of colon cancer or polyps. No family history of uterine/endometrial cancer, pancreatic cancer or gastric/stomach cancer.  Past Medical History:  Diagnosis Date   Arthritis    knees, shoulders  Cataract    surgery   Hypertension    Obesity    Pre-diabetes     Past Surgical History:  Procedure Laterality Date   ABDOMINAL HYSTERECTOMY     BLADDER SUSPENSION     CATARACT EXTRACTION, BILATERAL     KNEE ARTHROSCOPY WITH MEDIAL MENISECTOMY Right 09/18/2016   Procedure: KNEE ARTHROSCOPY WITH MEDIAL MENISCECTOMY, LATERAL MENISCECTOMY,  CHONDROPLASTY;  Surgeon: Dorna Leitz, MD;  Location: [place];  Service: Orthopedics;  Laterality: Right;  KNEE ARTHROSCOPY, MEDIAL CHONDROPLASTY, MEDIAL LATERAL MENISCAL TEAR, MEDIAL MENEISCECTOMY, CHONDROPLASTY OF THE PATELLA   TUBAL LIGATION     UTERINE FIBROID SURGERY      Current Outpatient Medications  Medication Sig Dispense Refill   aspirin EC 81 MG tablet Take 81 mg by mouth daily.     calcium carbonate (OS-CAL) 600 MG TABS tablet Take 600 mg by mouth daily.      Casanthranol-Docusate Sodium 30-100 MG CAPS Take 1 capsule by mouth daily as needed. For constipation.     dicyclomine (BENTYL) 20 MG tablet Take 1 tablet (20 mg total) by mouth 4 (four) times daily -  before meals and at bedtime. 120 tablet 3   estradiol (ESTRACE) 0.1 MG/GM vaginal cream Place 2 g vaginally 2 (two) times a week.      estradiol (VIVELLE-DOT) 0.1 MG/24HR Place 1 patch onto the skin 2 (two) times a week.     indomethacin (INDOCIN) 50 MG capsule Take 1 capsule (50 mg total) by mouth 2 (two) times daily with a meal. 30 capsule 1   lisinopril-hydrochlorothiazide (PRINZIDE,ZESTORETIC) 20-12.5 MG per tablet Take 1 tablet by mouth daily.     MULTIPLE VITAMIN PO Take 1 tablet by mouth daily.      oxyCODONE-acetaminophen (PERCOCET/ROXICET) 5-325 MG tablet Take 1-2 tablets by mouth every 6 (six) hours as needed for severe pain. 40 tablet 0   No current facility-administered medications for this visit.     Allergies as of 06/07/2019   (No Known Allergies)    Family History  Problem Relation Age of Onset   Hypertension Other    Hyperlipidemia Other    Heart disease Other    Diabetes Other    Obesity Other    Colon cancer Neg Hx     Social History   Socioeconomic History   Marital status: Married    Spouse name: Not on file   Number of children: Not on file   Years of education: Not on file   Highest education level: Not on file  Occupational History   Not on  file  Social Needs   Financial resource strain: Not on file   Food insecurity    Worry: Not on file    Inability: Not on file   Transportation needs    Medical: Not on file    Non-medical: Not on file  Tobacco Use   Smoking status: Never Smoker   Smokeless tobacco: Never Used  Substance and Sexual Activity   Alcohol use: No   Drug use: No   Sexual activity: Not on file  Lifestyle   Physical activity    Days per week: Not on file    Minutes per session: Not on file   Stress: Not on file  Relationships   Social connections    Talks on phone: Not on file    Gets together: Not on file    Attends religious service: Not on file    Active member of club or organization: Not on  file    Attends meetings of clubs or organizations: Not on file    Relationship status: Not on file   Intimate partner violence    Fear of current or ex partner: Not on file    Emotionally abused: Not on file    Physically abused: Not on file    Forced sexual activity: Not on file  Other Topics Concern   Not on file  Social History Narrative   Not on file    Physical Exam: Complete physical exam not performed due to the limits inherent in a telehealth encounter.  General: Awake, alert, and oriented, and well communicative. In no acute distress.  HEENT: EOMI, non-icteric sclera, NCAT, MMM  Neck: Normal movement of head and neck  Pulm: No labored breathing, speaking in full sentences without conversational dyspnea  Derm: No apparent lesions or bruising in visible field  MS: Moves all visible extremities without noticeable abnormality  Psych: Pleasant, cooperative, normal speech, normal affect and normal insight Neuro: Alert and appropriate   Kamdon Reisig L. Tarri Glenn, MD, MPH [facility] 06/07/2019, 1:33 PM

## 2019-06-07 NOTE — Patient Instructions (Addendum)
Colonoscopy and upper endoscopy recommended for further evaluation.  In the meantime, keep track of the foods that seem to trigger symptoms. Some people find that a written record, or food journal, helps them identify problem foods. Record details such as what you eat, what time of day, and how much and how you feel afterward. By avoiding foods that seem to cause discomfort, you may be able to reduce symptoms.   Some dietary tips that may help relief symptoms include: - Drink plenty of water and avoid carbonated beverages, which may cause gas - Don't chew gum or eat too quickly; these behaviors can cause you to swallow air, which can cause gas. - Try eating smaller meals more frequently and avoid large meals. - Try increasing fiber in your diet gradually. Fiber can help reduce both constipation and diarrhea, but it can also make gas and cramping worse. I recommended slowly increasing the amount of fiber that you eat. - Avoid foods and beverages that make you fee worse. For some people these include alcohol, drinks with caffeine, dairy products, beans, cauliflower, cabbage, and broccoli.   My favorite websites for more information include: https://lambert-jackson.net/ [employer].co.uk

## 2019-06-24 HISTORY — PX: COLONOSCOPY: SHX174

## 2019-07-06 ENCOUNTER — Encounter: Payer: Self-pay | Admitting: Gastroenterology

## 2019-07-12 ENCOUNTER — Telehealth: Payer: Self-pay

## 2019-07-12 NOTE — Telephone Encounter (Signed)
Covid-19 screening questions   Do you now or have you had a fever in the last 14 days?  Do you have any respiratory symptoms of shortness of breath or cough now or in the last 14 days?  Do you have any family members or close contacts with diagnosed or suspected Covid-19 in the past 14 days?  Have you been tested for Covid-19 and found to be positive?       

## 2019-07-12 NOTE — Telephone Encounter (Signed)
Pt answered  "NO" to all covid questions. °

## 2019-07-13 ENCOUNTER — Encounter: Payer: Self-pay | Admitting: Gastroenterology

## 2019-07-13 ENCOUNTER — Other Ambulatory Visit: Payer: Self-pay

## 2019-07-13 ENCOUNTER — Ambulatory Visit (AMBULATORY_SURGERY_CENTER): Payer: Medicare Other | Admitting: Gastroenterology

## 2019-07-13 ENCOUNTER — Other Ambulatory Visit: Payer: Self-pay | Admitting: Gastroenterology

## 2019-07-13 VITALS — BP 99/60 | HR 72 | Temp 98.4°F | Resp 17 | Ht 64.0 in | Wt 197.0 lb

## 2019-07-13 DIAGNOSIS — K269 Duodenal ulcer, unspecified as acute or chronic, without hemorrhage or perforation: Secondary | ICD-10-CM

## 2019-07-13 DIAGNOSIS — K2950 Unspecified chronic gastritis without bleeding: Secondary | ICD-10-CM

## 2019-07-13 DIAGNOSIS — R1084 Generalized abdominal pain: Secondary | ICD-10-CM | POA: Diagnosis not present

## 2019-07-13 DIAGNOSIS — R194 Change in bowel habit: Secondary | ICD-10-CM | POA: Diagnosis not present

## 2019-07-13 DIAGNOSIS — K2980 Duodenitis without bleeding: Secondary | ICD-10-CM

## 2019-07-13 MED ORDER — SODIUM CHLORIDE 0.9 % IV SOLN: 500.0000 mL | Freq: Once | INTRAVENOUS | Status: DC

## 2019-07-13 MED ORDER — PANTOPRAZOLE SODIUM 40 MG PO TBEC: 40.0000 mg | DELAYED_RELEASE_TABLET | Freq: Two times a day (BID) | ORAL | 0 refills | Status: DC

## 2019-07-13 NOTE — Progress Notes (Signed)
Called to room to assist during endoscopic procedure.  Patient ID and intended procedure confirmed with present staff. Received instructions for my participation in the procedure from the performing physician.  

## 2019-07-13 NOTE — Progress Notes (Signed)
Repeat EGD recommended for 8-10 weeks.  Unable to make appt at this time, shedule N/A.

## 2019-07-13 NOTE — Progress Notes (Signed)
To PACU, VSS. Report to Rn.tb 

## 2019-07-13 NOTE — Op Note (Signed)
[facility] Patient Name: Lori FallingHazel [name] Procedure Date: 07/13/2019 8:30 AM MRN: [id number] Endoscopist: Tressia DanasKimberly Kielee Care MD, MD Age: [age] Referring MD:  Date of Birth: 08-07-1951 Gender: Female Account #: [id number] Procedure:                Upper GI endoscopy Indications:              Generalized abdominal pain Medicines:                See the Anesthesia note for documentation of the                            administered medications Procedure:                Pre-Anesthesia Assessment:                           - Prior to the procedure, a History and Physical                            was performed, and patient medications and                            allergies were reviewed. The patient's tolerance of                            previous anesthesia was also reviewed. The risks                            and benefits of the procedure and the sedation                            options and risks were discussed with the patient.                            All questions were answered, and informed consent                            was obtained. Prior Anticoagulants: The patient has                            taken no previous anticoagulant or antiplatelet                            agents. ASA Grade Assessment: II - A patient with                            mild systemic disease. After reviewing the risks                            and benefits, the patient was deemed in                            satisfactory condition to undergo the procedure.  After obtaining informed consent, the endoscope was                            passed under direct vision. Throughout the                            procedure, the patient's blood pressure, pulse, and                            oxygen saturations were monitored continuously. The                            Endoscope was introduced through the mouth, and                            advanced to the third part  of duodenum. The upper                            GI endoscopy was accomplished without difficulty.                            The patient tolerated the procedure well. Scope In: Scope Out: Findings:                 LA Grade A (one or more mucosal breaks less than 5                            mm, not extending between tops of 2 mucosal folds)                            esophagitis with no bleeding was found. Biopsies                            were taken with a cold forceps for histology.                            Estimated blood loss was minimal.                           A small hiatal hernia was present.                           The entire examined stomach was normal. Biopsies                            were taken with a cold forceps for histology.                            Estimated blood loss: none.                           Two non-bleeding cratered duodenal ulcers with no  stigmata of bleeding were found in the duodenal                            bulb. The largest lesion was 10 mm in largest                            dimension. Scope trauma caused some bleeding                            adjacent to the ulcer but the ulcer was not                            bleeding. Biopsies were taken with a cold forceps                            for histology. Estimated blood loss was minimal.                            The mucosa distal to the ulcers appeared normal.                            Biopsies were obtained with cold forceps. Complications:            No immediate complications. Estimated blood loss:                            Minimal. Estimated Blood Loss:     Estimated blood loss was minimal. Impression:               - LA Grade A esophagitis. Biopsied.                           - Small hiatal hernia.                           - Normal stomach. Biopsied.                           - Two, cratered non-bleeding duodenal ulcers with                             no stigmata of bleeding. Biopsied. Recommendation:           - Patient has a contact number available for                            emergencies. The signs and symptoms of potential                            delayed complications were discussed with the                            patient. Return to normal activities tomorrow.  Written discharge instructions were provided to the                            patient.                           - Resume previous diet today.                           - Continue present medications.                           - No aspirin, ibuprofen, naproxen, or other                            non-steroidal anti-inflammatory drugs.                           - Use Protonix (pantoprazole) 40 mg PO BID for 8                            weeks.                           - Repeat upper endoscopy in 8-10 weeks to check                            healing of the duodenal ulcers. Thornton Park MD, MD 07/13/2019 9:02:24 AM This report has been signed electronically.

## 2019-07-13 NOTE — Patient Instructions (Signed)
Handouts given for Esophagitis, Hiatal Hernia and Peptic ulcer disease.  Your colon was normal.  Pick up new medicine today and start taking it.  Do not use Aspirin, ibuprofen, naproxen or other NSAIDS, you may use Tylenol.  You need a repeat Endoscopy in 8-10 weeks.  The office will call to set up.  YOU HAD AN ENDOSCOPIC PROCEDURE TODAY AT [facility]:   Refer to the procedure report that was given to you for any specific questions about what was found during the examination.  If the procedure report does not answer your questions, please call your gastroenterologist to clarify.  If you requested that your care partner not be given the details of your procedure findings, then the procedure report has been included in a sealed envelope for you to review at your convenience later.  YOU SHOULD EXPECT: Some feelings of bloating in the abdomen. Passage of more gas than usual.  Walking can help get rid of the air that was put into your GI tract during the procedure and reduce the bloating. If you had a lower endoscopy (such as a colonoscopy or flexible sigmoidoscopy) you may notice spotting of blood in your stool or on the toilet paper. If you underwent a bowel prep for your procedure, you may not have a normal bowel movement for a few days.  Please Note:  You might notice some irritation and congestion in your nose or some drainage.  This is from the oxygen used during your procedure.  There is no need for concern and it should clear up in a day or so.  SYMPTOMS TO REPORT IMMEDIATELY:   Following lower endoscopy (colonoscopy or flexible sigmoidoscopy):  Excessive amounts of blood in the stool  Significant tenderness or worsening of abdominal pains  Swelling of the abdomen that is new, acute  Fever of 100F or higher   Following upper endoscopy (EGD)  Vomiting of blood or coffee ground material  New chest pain or pain under the shoulder blades  Painful or persistently  difficult swallowing  New shortness of breath  Fever of 100F or higher  Black, tarry-looking stools  For urgent or emergent issues, a gastroenterologist can be reached at any hour by calling [phone].   DIET:  We do recommend a small meal at first, but then you may proceed to your regular diet.  Drink plenty of fluids but you should avoid alcoholic beverages for 24 hours.  ACTIVITY:  You should plan to take it easy for the rest of today and you should NOT DRIVE or use heavy machinery until tomorrow (because of the sedation medicines used during the test).    FOLLOW UP: Our staff will call the number listed on your records 48-72 hours following your procedure to check on you and address any questions or concerns that you may have regarding the information given to you following your procedure. If we do not reach you, we will leave a message.  We will attempt to reach you two times.  During this call, we will ask if you have developed any symptoms of COVID 19. If you develop any symptoms (ie: fever, flu-like symptoms, shortness of breath, cough etc.) before then, please call [phone].  If you test positive for Covid 19 in the 2 weeks post procedure, please call and report this information to [place].    If any biopsies were taken you will be contacted by phone or by letter within the next 1-3 weeks.  Please call us  at [phone] if you have not heard about the biopsies in 3 weeks.    SIGNATURES/CONFIDENTIALITY: You and/or your care partner have signed paperwork which will be entered into your electronic medical record.  These signatures attest to the fact that that the information above on your After Visit Summary has been reviewed and is understood.  Full responsibility of the confidentiality of this discharge information lies with you and/or your care-partner.

## 2019-07-13 NOTE — Op Note (Signed)
[facility] Patient Name: Lori [name] Procedure Date: 07/13/2019 8:30 AM MRN: [id number] Endoscopist: Thornton Park MD, MD Age: 68 Referring MD:  Date of Birth: 10/25/1950 Gender: Female Account #: [id number] Procedure:                Colonoscopy Indications:              Generalized abdominal pain, Change in bowel habits                           Prior colonoscopy with Dr. Deatra Ina in 2008 and 2015                            revealed no polyps                           No known family history of colon cancer or polyps. Medicines:                See the Anesthesia note for documentation of the                            administered medications Procedure:                Pre-Anesthesia Assessment:                           - Prior to the procedure, a History and Physical                            was performed, and patient medications and                            allergies were reviewed. The patient's tolerance of                            previous anesthesia was also reviewed. The risks                            and benefits of the procedure and the sedation                            options and risks were discussed with the patient.                            All questions were answered, and informed consent                            was obtained. Prior Anticoagulants: The patient has                            taken no previous anticoagulant or antiplatelet                            agents. ASA Grade Assessment: II - A patient with  mild systemic disease. After reviewing the risks                            and benefits, the patient was deemed in                            satisfactory condition to undergo the procedure.                           After obtaining informed consent, the colonoscope                            was passed under direct vision. Throughout the                            procedure, the patient's blood pressure,  pulse, and                            oxygen saturations were monitored continuously. The                            Colonoscope was introduced through the anus and                            advanced to the the terminal ileum, with                            identification of the appendiceal orifice and IC                            valve. A second forward view of the right colon was                            performed. The colonoscopy was performed without                            difficulty. The patient tolerated the procedure                            well. The quality of the bowel preparation was                            good. The terminal ileum, ileocecal valve,                            appendiceal orifice, and rectum were photographed. Scope In: 8:44:58 AM Scope Out: 8:55:11 AM Scope Withdrawal Time: 0 hours 7 minutes 53 seconds  Total Procedure Duration: 0 hours 10 minutes 13 seconds  Findings:                 The perianal and digital rectal examinations were                            normal.  The entire examined colon appeared normal on direct                            and retroflexion views. Complications:            No immediate complications. Estimated Blood Loss:     Estimated blood loss: none. Impression:               - The entire examined colon is normal on direct and                            retroflexion views.                           - No specimens collected. Recommendation:           - Patient has a contact number available for                            emergencies. The signs and symptoms of potential                            delayed complications were discussed with the                            patient. Return to normal activities tomorrow.                            Written discharge instructions were provided to the                            patient.                           - Resume previous diet today.                            - Continue present medications.                           - Repeat colonoscopy in 10 years for screening                            purposes. Tressia Danas MD, MD 07/13/2019 9:05:22 AM This report has been signed electronically.

## 2019-07-15 ENCOUNTER — Other Ambulatory Visit: Payer: Self-pay | Admitting: *Deleted

## 2019-07-15 ENCOUNTER — Telehealth: Payer: Self-pay

## 2019-07-15 ENCOUNTER — Encounter: Payer: Self-pay | Admitting: *Deleted

## 2019-07-15 DIAGNOSIS — Z1159 Encounter for screening for other viral diseases: Secondary | ICD-10-CM

## 2019-07-15 MED ORDER — PYLERA 140-125-125 MG PO CAPS: 3.0000 | ORAL_CAPSULE | Freq: Three times a day (TID) | ORAL | 0 refills | Status: DC

## 2019-07-15 NOTE — Telephone Encounter (Signed)
  Follow up Call-  Call back number 07/13/2019  Post procedure Call Back phone  # [phone]  Permission to leave phone message Yes  Some recent data might be hidden     Patient questions:  Do you have a fever, pain , or abdominal swelling? No. Pain Score  0 *  Have you tolerated food without any problems? Yes.    Have you been able to return to your normal activities? Yes.    Do you have any questions about your discharge instructions: Diet   No. Medications  No. Follow up visit  No.  Do you have questions or concerns about your Care? No.  Actions: * If pain score is 4 or above: No action needed, pain <4. 1. Have you developed a fever since your procedure? no  2.   Have you had an respiratory symptoms (SOB or cough) since your procedure? no  3.   Have you tested positive for COVID 19 since your procedure no  4.   Have you had any family members/close contacts diagnosed with the COVID 19 since your procedure?  no   If yes to any of these questions please route to Joylene John, RN and Alphonsa Gin, Therapist, sports.

## 2019-07-16 ENCOUNTER — Telehealth: Payer: Self-pay | Admitting: Gastroenterology

## 2019-07-16 NOTE — Telephone Encounter (Signed)
Pt states that copay for pylera is over $100, she wants to know if there is a coupon that we can give her or prescribe something more affordable.

## 2019-07-19 MED ORDER — DOXYCYCLINE HYCLATE 100 MG PO CAPS: 100.0000 mg | ORAL_CAPSULE | Freq: Two times a day (BID) | ORAL | 0 refills | Status: DC

## 2019-07-19 MED ORDER — BISMUTH SUBSALICYLATE 262 MG PO CHEW: 524.0000 mg | CHEWABLE_TABLET | Freq: Four times a day (QID) | ORAL | 0 refills | Status: DC

## 2019-07-19 MED ORDER — BISMUTH SUBSALICYLATE 262 MG PO CHEW: 524.0000 mg | CHEWABLE_TABLET | Freq: Four times a day (QID) | ORAL | 0 refills | Status: AC

## 2019-07-19 MED ORDER — METRONIDAZOLE 250 MG PO TABS: 250.0000 mg | ORAL_TABLET | Freq: Two times a day (BID) | ORAL | 0 refills | Status: AC

## 2019-07-19 NOTE — Addendum Note (Signed)
Addended by: Wyline Beady on: 07/19/2019 04:43 PM   Modules accepted: Orders

## 2019-07-19 NOTE — Telephone Encounter (Signed)
See mychart message sent to patient. Rx sent to pharmacy.

## 2019-08-24 HISTORY — PX: UPPER GASTROINTESTINAL ENDOSCOPY: SHX188

## 2019-08-25 ENCOUNTER — Other Ambulatory Visit: Payer: Self-pay | Admitting: Orthopedic Surgery

## 2019-08-26 ENCOUNTER — Ambulatory Visit (AMBULATORY_SURGERY_CENTER): Payer: Self-pay

## 2019-08-26 ENCOUNTER — Other Ambulatory Visit: Payer: Self-pay

## 2019-08-26 ENCOUNTER — Encounter: Payer: Self-pay | Admitting: Gastroenterology

## 2019-08-26 VITALS — Temp 97.1°F | Ht 64.0 in | Wt 193.8 lb

## 2019-08-26 DIAGNOSIS — K269 Duodenal ulcer, unspecified as acute or chronic, without hemorrhage or perforation: Secondary | ICD-10-CM

## 2019-08-26 NOTE — Progress Notes (Signed)
Denies allergies to eggs or soy products. Denies complication of anesthesia or sedation. Denies use of weight loss medication. Denies use of O2.   Emmi instructions given for colonoscopy.  Covid screening is scheduled for Tuesday 09/07/19 @ 2:00 Pm.

## 2019-09-03 ENCOUNTER — Other Ambulatory Visit: Payer: Self-pay | Admitting: Orthopedic Surgery

## 2019-09-04 ENCOUNTER — Other Ambulatory Visit: Payer: Self-pay | Admitting: Gastroenterology

## 2019-09-04 DIAGNOSIS — K269 Duodenal ulcer, unspecified as acute or chronic, without hemorrhage or perforation: Secondary | ICD-10-CM

## 2019-09-07 ENCOUNTER — Ambulatory Visit (INDEPENDENT_AMBULATORY_CARE_PROVIDER_SITE_OTHER): Payer: Medicare Other

## 2019-09-07 ENCOUNTER — Other Ambulatory Visit: Payer: Self-pay | Admitting: Gastroenterology

## 2019-09-07 DIAGNOSIS — Z1159 Encounter for screening for other viral diseases: Secondary | ICD-10-CM

## 2019-09-08 LAB — SARS CORONAVIRUS 2 (TAT 6-24 HRS): SARS Coronavirus 2: NEGATIVE

## 2019-09-09 ENCOUNTER — Other Ambulatory Visit: Payer: Self-pay

## 2019-09-09 ENCOUNTER — Ambulatory Visit (AMBULATORY_SURGERY_CENTER): Payer: Medicare Other | Admitting: Gastroenterology

## 2019-09-09 ENCOUNTER — Encounter: Payer: Self-pay | Admitting: Gastroenterology

## 2019-09-09 VITALS — BP 107/72 | HR 72 | Temp 98.1°F | Resp 13 | Ht 64.0 in | Wt 193.8 lb

## 2019-09-09 DIAGNOSIS — K2950 Unspecified chronic gastritis without bleeding: Secondary | ICD-10-CM | POA: Diagnosis not present

## 2019-09-09 DIAGNOSIS — K269 Duodenal ulcer, unspecified as acute or chronic, without hemorrhage or perforation: Secondary | ICD-10-CM

## 2019-09-09 DIAGNOSIS — K2970 Gastritis, unspecified, without bleeding: Secondary | ICD-10-CM

## 2019-09-09 DIAGNOSIS — K449 Diaphragmatic hernia without obstruction or gangrene: Secondary | ICD-10-CM

## 2019-09-09 DIAGNOSIS — B9681 Helicobacter pylori [H. pylori] as the cause of diseases classified elsewhere: Secondary | ICD-10-CM

## 2019-09-09 MED ORDER — SODIUM CHLORIDE 0.9 % IV SOLN: 500.0000 mL | Freq: Once | INTRAVENOUS | Status: DC

## 2019-09-09 NOTE — Progress Notes (Signed)
Temp-JB VS-CW  Pt's states no medical or surgical changes since previsit or office visit.  

## 2019-09-09 NOTE — Op Note (Signed)
[facility] Patient Name: Lori FallingHazel [name] Procedure Date: 09/09/2019 7:55 AM MRN: [id number] Endoscopist: Tressia DanasKimberly Toben Acuna MD, MD Age: 68 Referring MD:  Date of Birth: 1951-02-03 Gender: Female Account #: [id number] Procedure:                Upper GI endoscopy Indications:              Follow-up of acute duodenal ulcer                           History of 2 H pylori + duodenal ulcers on EGD                            07/13/19 Medicines:                Monitored Anesthesia Care Procedure:                Pre-Anesthesia Assessment:                           - Prior to the procedure, a History and Physical                            was performed, and patient medications and                            allergies were reviewed. The patient's tolerance of                            previous anesthesia was also reviewed. The risks                            and benefits of the procedure and the sedation                            options and risks were discussed with the patient.                            All questions were answered, and informed consent                            was obtained. Prior Anticoagulants: The patient has                            taken no previous anticoagulant or antiplatelet                            agents. ASA Grade Assessment: II - A patient with                            mild systemic disease. After reviewing the risks                            and benefits, the patient was deemed in  satisfactory condition to undergo the procedure.                           After obtaining informed consent, the endoscope was                            passed under direct vision. Throughout the                            procedure, the patient's blood pressure, pulse, and                            oxygen saturations were monitored continuously. The                            Endoscope was introduced through the mouth, and                advanced to the third part of duodenum. The upper                            GI endoscopy was accomplished without difficulty.                            The patient tolerated the procedure well. Scope In: Scope Out: Findings:                 The esophagus was normal. No ring, web, stricture,                            or esophagitis.                           A small hiatal hernia was present.                           The entire examined stomach was normal. Biopsies                            were taken from the antrum, body, and fundus with a                            cold forceps for histology. Estimated blood loss                            was minimal.                           A mild post-ulcer deformity was found in the                            duodenal bulb. Mild erythema was present. No active                            ulcer seen.  The cardia and gastric fundus were normal on                            retroflexion. Complications:            No immediate complications. Estimated blood loss:                            Minimal. Estimated Blood Loss:     Estimated blood loss was minimal. Impression:               - Normal esophagus.                           - Small hiatal hernia.                           - Normal stomach. Biopsied to document response to                            H pylori treatment.                           - Duodenal deformity from healed duodenal ulcers. Recommendation:           - Patient has a contact number available for                            emergencies. The signs and symptoms of potential                            delayed complications were discussed with the                            patient. Return to normal activities tomorrow.                            Written discharge instructions were provided to the                            patient.                           - Resume previous diet.                            - Continue present medications.                           - Await pathology results. Thornton Park MD, MD 09/09/2019 8:14:12 AM This report has been signed electronically.

## 2019-09-09 NOTE — Progress Notes (Signed)
Called to room to assist during endoscopic procedure.  Patient ID and intended procedure confirmed with present staff. Received instructions for my participation in the procedure from the performing physician.  

## 2019-09-09 NOTE — Progress Notes (Signed)
To PACU, VSS. Report to Rn.tb 

## 2019-09-09 NOTE — Patient Instructions (Signed)
Await pathology results.     YOU HAD AN ENDOSCOPIC PROCEDURE TODAY AT THE [facility]:   Refer to the procedure report that was given to you for any specific questions about what was found during the examination.  If the procedure report does not answer your questions, please call your gastroenterologist to clarify.  If you requested that your care partner not be given the details of your procedure findings, then the procedure report has been included in a sealed envelope for you to review at your convenience later.  YOU SHOULD EXPECT: Some feelings of bloating in the abdomen. Passage of more gas than usual.  Walking can help get rid of the air that was put into your GI tract during the procedure and reduce the bloating. If you had a lower endoscopy (such as a colonoscopy or flexible sigmoidoscopy) you may notice spotting of blood in your stool or on the toilet paper. If you underwent a bowel prep for your procedure, you may not have a normal bowel movement for a few days.  Please Note:  You might notice some irritation and congestion in your nose or some drainage.  This is from the oxygen used during your procedure.  There is no need for concern and it should clear up in a day or so.  SYMPTOMS TO REPORT IMMEDIATELY:    Following upper endoscopy (EGD)  Vomiting of blood or coffee ground material  New chest pain or pain under the shoulder blades  Painful or persistently difficult swallowing  New shortness of breath  Fever of 100F or higher  Black, tarry-looking stools  For urgent or emergent issues, a gastroenterologist can be reached at any hour by calling [phone].   DIET:  We do recommend a small meal at first, but then you may proceed to your regular diet.  Drink plenty of fluids but you should avoid alcoholic beverages for 24 hours.  ACTIVITY:  You should plan to take it easy for the rest of today and you should NOT DRIVE or use heavy machinery until tomorrow  (because of the sedation medicines used during the test).    FOLLOW UP: Our staff will call the number listed on your records 48-72 hours following your procedure to check on you and address any questions or concerns that you may have regarding the information given to you following your procedure. If we do not reach you, we will leave a message.  We will attempt to reach you two times.  During this call, we will ask if you have developed any symptoms of COVID 19. If you develop any symptoms (ie: fever, flu-like symptoms, shortness of breath, cough etc.) before then, please call [phone].  If you test positive for Covid 19 in the 2 weeks post procedure, please call and report this information to us.    If any biopsies were taken you will be contacted by phone or by letter within the next 1-3 weeks.  Please call us at [phone] if you have not heard about the biopsies in 3 weeks.    SIGNATURES/CONFIDENTIALITY: You and/or your care partner have signed paperwork which will be entered into your electronic medical record.  These signatures attest to the fact that that the information above on your After Visit Summary has been reviewed and is understood.  Full responsibility of the confidentiality of this discharge information lies with you and/or your care-partner. 

## 2019-09-13 ENCOUNTER — Telehealth: Payer: Self-pay

## 2019-09-13 NOTE — Telephone Encounter (Signed)
  Follow up Call-  Call back number 09/09/2019 07/13/2019  Post procedure Call Back phone  # [phone]  Permission to leave phone message Yes Yes  Some recent data might be hidden     Patient questions:  Do you have a fever, pain , or abdominal swelling? No. Pain Score  0 *  Have you tolerated food without any problems? Yes.    Have you been able to return to your normal activities? Yes.    Do you have any questions about your discharge instructions: Diet   No. Medications  No. Follow up visit  No.  Do you have questions or concerns about your Care? No.  Actions: * If pain score is 4 or above: No action needed, pain <4. 1. Have you developed a fever since your procedure? no  2.   Have you had an respiratory symptoms (SOB or cough) since your procedure? no  3.   Have you tested positive for COVID 19 since your procedure no  4.   Have you had any family members/close contacts diagnosed with the COVID 19 since your procedure?  no   If yes to any of these questions please route to Joylene John, RN and Alphonsa Gin, Therapist, sports.

## 2019-09-14 ENCOUNTER — Encounter: Payer: Self-pay | Admitting: Gastroenterology

## 2019-09-22 ENCOUNTER — Encounter (HOSPITAL_COMMUNITY): Payer: Self-pay

## 2019-09-22 NOTE — Patient Instructions (Addendum)
DUE TO COVID-19 ONLY ONE VISITOR IS ALLOWED TO COME WITH YOU AND STAY IN THE WAITING ROOM ONLY DURING PRE OP AND PROCEDURE. THE ONE VISITOR MAY VISIT WITH YOU IN YOUR PRIVATE ROOM DURING VISITING HOURS ONLY!!   COVID SWAB TESTING MUST BE COMPLETED ON: Tuesday, Jan. 5, 2021 at 8:30AM [address], [place] [facility] pre surgical testing line (Must self quarantine after testing. Follow instructions on handout.)              Your procedure is scheduled on: Friday, Jan. 8, 2021    [facility]   Report to Short Stay at 5:30 AM   Call this number if you have problems the morning of surgery [phone]   Do not eat food :After Midnight. May have liquids until 4:15 AM day of surgery. Complete one G2 low sugar gatorade drink the morning of surgery at 4:15AM the day of surgery.     CLEAR LIQUID DIET  Foods Allowed                                                                     Foods Excluded  Water, Black Coffee and tea, regular and decaf                             liquids that you cannot  Plain Jell-O in any flavor  (No red)                                           see through such as: Fruit ices (not with fruit pulp)                                     milk, soups, orange juice  Iced Popsicles (No red)                                    All solid food Carbonated beverages, regular and diet                                    Apple juices Sports drinks like Gatorade (No red) Lightly seasoned clear broth or consume(fat free) Sugar, honey syrup  Sample Menu Breakfast                                Lunch                                     Supper Cranberry juice                    Beef broth  Chicken broth Jell-O                                     Grape juice                           Apple juice Coffee or tea                        Jell-O                                      Popsicle                                                 Coffee or tea                        Coffee or tea      Brush your teeth the morning of surgery.   Do NOT smoke after Midnight   Take these medicines the morning of surgery with A SIP OF WATER: Pantoprazole, Tylenol if needed                               You may not have any metal on your body including hair pins, jewelry, and body piercings             Do not wear make-up, lotions, powders, perfumes/cologne, or deodorant             Do not wear nail polish.  Do not shave  48 hours prior to surgery.                Do not bring valuables to the hospital. [name] IS NOT             RESPONSIBLE   FOR VALUABLES.   Contacts, dentures or bridgework may not be worn into surgery.   Bring small overnight bag day of surgery.    Patients discharged the day of surgery will not be allowed to drive home.               Please read over the following fact sheets you were given:     [facility] - Preparing for Surgery Before surgery, you can play an important role.  Because skin is not sterile, your skin needs to be as free of germs as possible.  You can reduce the number of germs on your skin by washing with CHG (chlorahexidine gluconate) soap before surgery.  CHG is an antiseptic cleaner which kills germs and bonds with the skin to continue killing germs even after washing. Please DO NOT use if you have an allergy to CHG or antibacterial soaps.  If your skin becomes reddened/irritated stop using the CHG and inform your nurse when you arrive at Short Stay. Do not shave (including legs and underarms) for at least 48 hours prior to the first CHG shower.  You may shave your face/neck. Please follow these instructions carefully:  1.  Shower with CHG Soap the night before surgery and the  morning  of Surgery.  2.  If you choose to wash your hair, wash your hair first as usual with your  normal  shampoo.  3.  After you shampoo, rinse your hair and body  thoroughly to remove the  shampoo.                           4.  Use CHG as you would any other liquid soap.  You can apply chg directly  to the skin and wash                       Gently with a scrungie or clean washcloth.  5.  Apply the CHG Soap to your body ONLY FROM THE NECK DOWN.   Do not use on face/ open                           Wound or open sores. Avoid contact with eyes, ears mouth and genitals (private parts).                       Wash face,  Genitals (private parts) with your normal soap.             6.  Wash thoroughly, paying special attention to the area where your surgery  will be performed.  7.  Thoroughly rinse your body with warm water from the neck down.  8.  DO NOT shower/wash with your normal soap after using and rinsing off  the CHG Soap.                9.  Pat yourself dry with a clean towel.            10.  Wear clean pajamas.            11.  Place clean sheets on your bed the night of your first shower and do not  sleep with pets. Day of Surgery : Do not apply any lotions/deodorants the morning of surgery.  Please wear clean clothes to the hospital/surgery center.  FAILURE TO FOLLOW THESE INSTRUCTIONS MAY RESULT IN THE CANCELLATION OF YOUR SURGERY PATIENT SIGNATURE_________________________________  NURSE SIGNATURE__________________________________  ________________________________________________________________________     Lori [name]  An incentive spirometer is a tool that can help keep your lungs clear and active. This tool measures how well you are filling your lungs with each breath. Taking long deep breaths may help reverse or decrease the chance of developing breathing (pulmonary) problems (especially infection) following:  A long period of time when you are unable to move or be active. BEFORE THE PROCEDURE   If the spirometer includes an indicator to show your best effort, your nurse or respiratory therapist will set it to a desired  goal.  If possible, sit up straight or lean slightly forward. Try not to slouch.  Hold the incentive spirometer in an upright position. INSTRUCTIONS FOR USE  1. Sit on the edge of your bed if possible, or sit up as far as you can in bed or on a chair. 2. Hold the incentive spirometer in an upright position. 3. Breathe out normally. 4. Place the mouthpiece in your mouth and seal your lips tightly around it. 5. Breathe in slowly and as deeply as possible, raising the piston or the ball toward the top of the column. 6. Hold your breath for 3-5 seconds or for as long  as possible. Allow the piston or ball to fall to the bottom of the column. 7. Remove the mouthpiece from your mouth and breathe out normally. 8. Rest for a few seconds and repeat Steps 1 through 7 at least 10 times every 1-2 hours when you are awake. Take your time and take a few normal breaths between deep breaths. 9. The spirometer may include an indicator to show your best effort. Use the indicator as a goal to work toward during each repetition. 10. After each set of 10 deep breaths, practice coughing to be sure your lungs are clear. If you have an incision (the cut made at the time of surgery), support your incision when coughing by placing a pillow or rolled up towels firmly against it. Once you are able to get out of bed, walk around indoors and cough well. You may stop using the incentive spirometer when instructed by your caregiver.  RISKS AND COMPLICATIONS  Take your time so you do not get dizzy or light-headed.  If you are in pain, you may need to take or ask for pain medication before doing incentive spirometry. It is harder to take a deep breath if you are having pain. AFTER USE  Rest and breathe slowly and easily.  It can be helpful to keep track of a log of your progress. Your caregiver can provide you with a simple table to help with this. If you are using the spirometer at home, follow these instructions: SEEK  MEDICAL CARE IF:   You are having difficultly using the spirometer.  You have trouble using the spirometer as often as instructed.  Your pain medication is not giving enough relief while using the spirometer.  You develop fever of 100.5 F (38.1 C) or higher. SEEK IMMEDIATE MEDICAL CARE IF:   You cough up bloody sputum that had not been present before.  You develop fever of 102 F (38.9 C) or greater.  You develop worsening pain at or near the incision site. MAKE SURE YOU:   Understand these instructions.  Will watch your condition.  Will get help right away if you are not doing well or get worse. Document Released: 01/20/2007 Document Revised: 12/02/2011 Document Reviewed: 03/23/2007 Exit[employer] Patient Information 2014 Exit[employer], [place].   ________________________________________________________________________  WHAT IS A BLOOD TRANSFUSION? Blood Transfusion Information  A transfusion is the replacement of blood or some of its parts. Blood is made up of multiple cells which provide different functions.  Red blood cells carry oxygen and are used for blood loss replacement.  White blood cells fight against infection.  Platelets control bleeding.  Plasma helps clot blood.  Other blood products are available for specialized needs, such as hemophilia or other clotting disorders. BEFORE THE TRANSFUSION  Who gives blood for transfusions?   Healthy volunteers who are fully evaluated to make sure their blood is safe. This is blood bank blood. Transfusion therapy is the safest it has ever been in the practice of medicine. Before blood is taken from a donor, a complete history is taken to make sure that person has no history of diseases nor engages in risky social behavior (examples are intravenous drug use or sexual activity with multiple partners). The donor's travel history is screened to minimize risk of transmitting infections, such as malaria. The donated blood is tested for  signs of infectious diseases, such as HIV and hepatitis. The blood is then tested to be sure it is compatible with you in order to minimize the chance of  a transfusion reaction. If you or a relative donates blood, this is often done in anticipation of surgery and is not appropriate for emergency situations. It takes many days to process the donated blood. RISKS AND COMPLICATIONS Although transfusion therapy is very safe and saves many lives, the main dangers of transfusion include:   Getting an infectious disease.  Developing a transfusion reaction. This is an allergic reaction to something in the blood you were given. Every precaution is taken to prevent this. The decision to have a blood transfusion has been considered carefully by your caregiver before blood is given. Blood is not given unless the benefits outweigh the risks. AFTER THE TRANSFUSION  Right after receiving a blood transfusion, you will usually feel much better and more energetic. This is especially true if your red blood cells have gotten low (anemic). The transfusion raises the level of the red blood cells which carry oxygen, and this usually causes an energy increase.  The nurse administering the transfusion will monitor you carefully for complications. HOME CARE INSTRUCTIONS  No special instructions are needed after a transfusion. You may find your energy is better. Speak with your caregiver about any limitations on activity for underlying diseases you may have. SEEK MEDICAL CARE IF:   Your condition is not improving after your transfusion.  You develop redness or irritation at the intravenous (IV) site. SEEK IMMEDIATE MEDICAL CARE IF:  Any of the following symptoms occur over the next 12 hours:  Shaking chills.  You have a temperature by mouth above 102 F (38.9 C), not controlled by medicine.  Chest, back, or muscle pain.  People around you feel you are not acting correctly or are confused.  Shortness of breath  or difficulty breathing.  Dizziness and fainting.  You get a rash or develop hives.  You have a decrease in urine output.  Your urine turns a dark color or changes to pink, red, or brown. Any of the following symptoms occur over the next 10 days:  You have a temperature by mouth above 102 F (38.9 C), not controlled by medicine.  Shortness of breath.  Weakness after normal activity.  The white part of the eye turns yellow (jaundice).  You have a decrease in the amount of urine or are urinating less often.  Your urine turns a dark color or changes to pink, red, or brown. Document Released: 09/06/2000 Document Revised: 12/02/2011 Document Reviewed: 04/25/2008 [facility] Patient Information 2014 [place], [place].  _______________________________________________________________________

## 2019-09-25 ENCOUNTER — Other Ambulatory Visit: Payer: Self-pay | Admitting: Gastroenterology

## 2019-09-27 ENCOUNTER — Other Ambulatory Visit: Payer: Self-pay

## 2019-09-27 ENCOUNTER — Encounter (HOSPITAL_COMMUNITY): Payer: Self-pay

## 2019-09-27 ENCOUNTER — Encounter (HOSPITAL_COMMUNITY)
Admission: RE | Admit: 2019-09-27 | Discharge: 2019-09-27 | Disposition: A | Discharge status: Home / Self Care | Payer: Medicare PPO | Source: Ambulatory Visit | Attending: Orthopedic Surgery | Admitting: Orthopedic Surgery

## 2019-09-27 DIAGNOSIS — Z01818 Encounter for other preprocedural examination: Secondary | ICD-10-CM | POA: Diagnosis present

## 2019-09-27 DIAGNOSIS — U071 COVID-19: Secondary | ICD-10-CM | POA: Diagnosis not present

## 2019-09-27 HISTORY — DX: Helicobacter pylori (H. pylori) as the cause of diseases classified elsewhere: B96.81

## 2019-09-27 HISTORY — DX: Duodenal ulcer, unspecified as acute or chronic, without hemorrhage or perforation: K26.9

## 2019-09-27 HISTORY — DX: Diverticulosis of intestine, part unspecified, without perforation or abscess without bleeding: K57.90

## 2019-09-27 HISTORY — DX: Peptic ulcer, site unspecified, unspecified as acute or chronic, without hemorrhage or perforation: K27.9

## 2019-09-27 HISTORY — DX: Irritable bowel syndrome, unspecified: K58.9

## 2019-09-27 NOTE — Care Plan (Signed)
Ortho Bundle Case Management Note  Patient Details  Name: Lori [name] MRN: [id number] Date of Birth: 1950/11/17  Spoke with patient prior to surgery. She is planning to discharge to home, hopefully same day, with family. HHPT referral to [facility]. OPPT set up with [facility]  Rolling walker and 3n1 ordered to be delivered to hospitla by [facility].  Patient and MD in agreement with plan. Choice offered.                DME Arranged:  Walker rolling, Bedside commode DME Agency:  [employer]  HH Arranged:  PT HH Agency:  [facility] ([facility])  Additional Comments: Please contact me with any questions of if this plan should need to change.  Shauna Hugh,  RN,BSN,MHA,CCM  [facility]opaedic Specialist  [phone] 09/27/2019, 12:04 PM

## 2019-09-27 NOTE — Progress Notes (Signed)
PCP - Shirlean Mylar, MD Cardiologist -   Chest x-ray -  1/5 EKG - 1/5 Stress Test -  ECHO -  Cardiac Cath -   Sleep Study -n/a  CPAP -   Fasting Blood Sugar - n/a prediabetic  Checks Blood Sugar __n/a___ times a day  Blood Thinner Instructions:n/a Aspirin Instructions:n/a Last Dose:  Anesthesia review:  Hx of HTN , pre-dm   Patient denies shortness of breath, fever, cough and chest pain at PAT appointment   Patient verbalized understanding of instructions that were given to them at the PAT appointment. Patient was also instructed that they will need to review over the PAT instructions again at home before surgery.

## 2019-09-28 ENCOUNTER — Other Ambulatory Visit (HOSPITAL_COMMUNITY)
Admission: RE | Admit: 2019-09-28 | Discharge: 2019-09-28 | Disposition: A | Discharge status: Home / Self Care | Payer: Medicare PPO | Source: Ambulatory Visit | Attending: Orthopedic Surgery | Admitting: Orthopedic Surgery

## 2019-09-28 ENCOUNTER — Ambulatory Visit (HOSPITAL_COMMUNITY)
Admission: RE | Admit: 2019-09-28 | Discharge: 2019-09-28 | Disposition: A | Discharge status: Home / Self Care | Payer: Medicare PPO | Source: Ambulatory Visit | Attending: Orthopedic Surgery | Admitting: Orthopedic Surgery

## 2019-09-28 ENCOUNTER — Other Ambulatory Visit: Payer: Self-pay

## 2019-09-28 ENCOUNTER — Encounter (HOSPITAL_COMMUNITY)
Admission: RE | Admit: 2019-09-28 | Discharge: 2019-09-28 | Disposition: A | Discharge status: Home / Self Care | Payer: Medicare PPO | Source: Ambulatory Visit | Attending: Orthopedic Surgery | Admitting: Orthopedic Surgery

## 2019-09-28 DIAGNOSIS — Z01818 Encounter for other preprocedural examination: Secondary | ICD-10-CM | POA: Insufficient documentation

## 2019-09-28 DIAGNOSIS — Z01811 Encounter for preprocedural respiratory examination: Secondary | ICD-10-CM

## 2019-09-28 DIAGNOSIS — U071 COVID-19: Secondary | ICD-10-CM | POA: Insufficient documentation

## 2019-09-28 LAB — COMPREHENSIVE METABOLIC PANEL
ALT: 18 U/L (ref 0–44)
AST: 20 U/L (ref 15–41)
Albumin: 3.7 g/dL (ref 3.5–5.0)
Alkaline Phosphatase: 69 U/L (ref 38–126)
Anion gap: 8 (ref 5–15)
BUN: 10 mg/dL (ref 8–23)
CO2: 26 mmol/L (ref 22–32)
Calcium: 8.8 mg/dL — ABNORMAL LOW (ref 8.9–10.3)
Chloride: 104 mmol/L (ref 98–111)
Creatinine, Ser: 0.67 mg/dL (ref 0.44–1.00)
GFR calc Af Amer: >60 mL/min (ref >60)
GFR calc non Af Amer: >60 mL/min (ref >60)
Glucose, Bld: 105 mg/dL — ABNORMAL HIGH (ref 70–99)
Potassium: 3.6 mmol/L (ref 3.5–5.1)
Sodium: 138 mmol/L (ref 135–145)
Total Bilirubin: 0.5 mg/dL (ref 0.3–1.2)
Total Protein: 6.5 g/dL (ref 6.5–8.1)

## 2019-09-28 LAB — SURGICAL PCR SCREEN
MRSA, PCR: NEGATIVE
Staphylococcus aureus: NEGATIVE

## 2019-09-28 LAB — PROTIME-INR
INR: 1 (ref 0.8–1.2)
Prothrombin Time: 13.3 seconds (ref 11.4–15.2)

## 2019-09-28 LAB — URINALYSIS, ROUTINE W REFLEX MICROSCOPIC
Bilirubin Urine: NEGATIVE
Glucose, UA: NEGATIVE mg/dL
Hgb urine dipstick: NEGATIVE
Ketones, ur: NEGATIVE mg/dL
Leukocytes,Ua: NEGATIVE
Nitrite: NEGATIVE
Protein, ur: NEGATIVE mg/dL
Specific Gravity, Urine: 1.011 (ref 1.005–1.030)
pH: 6 (ref 5.0–8.0)

## 2019-09-28 LAB — CBC WITH DIFFERENTIAL/PLATELET
Abs Immature Granulocytes: 0.02 10*3/uL (ref 0.00–0.07)
Basophils Absolute: 0 10*3/uL (ref 0.0–0.1)
Basophils Relative: 1 %
Eosinophils Absolute: 0.1 10*3/uL (ref 0.0–0.5)
Eosinophils Relative: 1 %
HCT: 41.3 % (ref 36.0–46.0)
Hemoglobin: 13.6 g/dL (ref 12.0–15.0)
Immature Granulocytes: 0 %
Lymphocytes Relative: 13 %
Lymphs Abs: 0.8 10*3/uL (ref 0.7–4.0)
MCH: 28.5 pg (ref 26.0–34.0)
MCHC: 32.9 g/dL (ref 30.0–36.0)
MCV: 86.4 fL (ref 80.0–100.0)
Monocytes Absolute: 0.6 10*3/uL (ref 0.1–1.0)
Monocytes Relative: 10 %
Neutro Abs: 4.9 10*3/uL (ref 1.7–7.7)
Neutrophils Relative %: 75 %
Platelets: 257 10*3/uL (ref 150–400)
RBC: 4.78 MIL/uL (ref 3.87–5.11)
RDW: 13.1 % (ref 11.5–15.5)
WBC: 6.5 10*3/uL (ref 4.0–10.5)
nRBC: 0 % (ref 0.0–0.2)

## 2019-09-28 LAB — HEMOGLOBIN A1C
Hgb A1c MFr Bld: 6.1 % — ABNORMAL HIGH (ref 4.8–5.6)
Mean Plasma Glucose: 128.37 mg/dL

## 2019-09-28 LAB — APTT: aPTT: 40 seconds — ABNORMAL HIGH (ref 24–36)

## 2019-09-28 LAB — ABO/RH: ABO/RH(D): O POS

## 2019-09-29 ENCOUNTER — Encounter (HOSPITAL_COMMUNITY): Payer: Self-pay | Admitting: Physician Assistant

## 2019-09-30 LAB — NOVEL CORONAVIRUS, NAA (HOSP ORDER, SEND-OUT TO REF LAB; TAT 18-24 HRS): SARS-CoV-2, NAA: DETECTED — AB

## 2019-09-30 MED ORDER — BUPIVACAINE LIPOSOME 1.3 % IJ SUSP
10.0000 mL | Freq: Once | INTRAMUSCULAR | Status: DC
  Filled 2019-09-30: qty 10

## 2019-09-30 NOTE — Progress Notes (Signed)
VM left with surgical scheduler regarding pt's covid results. Awaiting callback if needed.   Avyn Aden N Rockne Dearinger, RN  

## 2019-10-01 ENCOUNTER — Ambulatory Visit (HOSPITAL_COMMUNITY)
Admission: RE | Admit: 2019-10-01 | Payer: Medicare PPO | Source: Other Acute Inpatient Hospital | Admitting: Orthopedic Surgery

## 2019-10-01 ENCOUNTER — Encounter (HOSPITAL_COMMUNITY): Admission: RE | Payer: Self-pay | Source: Other Acute Inpatient Hospital

## 2019-10-01 LAB — TYPE AND SCREEN
ABO/RH(D): O POS
Antibody Screen: NEGATIVE

## 2019-10-01 SURGERY — ARTHROPLASTY, HIP, TOTAL, ANTERIOR APPROACH
Anesthesia: Spinal | Site: Hip | Laterality: Right

## 2019-10-08 ENCOUNTER — Other Ambulatory Visit: Payer: Self-pay | Admitting: Orthopedic Surgery

## 2019-10-12 NOTE — Patient Instructions (Addendum)
DUE TO COVID-19 ONLY ONE VISITOR IS ALLOWED TO COME WITH YOU AND STAY IN THE WAITING ROOM ONLY DURING PRE OP AND PROCEDURE DAY OF SURGERY. THE 1 VISITOR MAY VISIT WITH YOU AFTER SURGERY IN YOUR PRIVATE ROOM DURING VISITING HOURS ONLY!              Lori [name]  10/12/2019   Your procedure is scheduled on: 10-15-19   Report to [facility] Main  Entrance    Report to Admitting at 8:00 AM     Call this number if you have problems the morning of surgery [phone]    Remember: Do not eat food or drink liquids :After Midnight.    Take these medicines the morning of surgery with A SIP OF WATER: Pantoprazole (Protonix)   BRUSH YOUR TEETH MORNING OF SURGERY AND RINSE YOUR MOUTH OUT, NO CHEWING GUM CANDY OR MINTS.                                You may not have any metal on your body including hair pins and              piercings     Do not wear jewelry, make-up, lotions, powders or perfumes, deodorant              Do not wear nail polish on your fingernails.  Do not shave  48 hours prior to surgery.                Do not bring valuables to the hospital. [place].  Contacts, dentures or bridgework may not be worn into surgery.       Patients discharged the day of surgery will not be allowed to drive home. IF YOU ARE HAVING SURGERY AND GOING HOME THE SAME DAY, YOU MUST HAVE AN ADULT TO DRIVE YOU HOME AND BE WITH YOU FOR 24 HOURS. YOU MAY GO HOME BY TAXI OR UBER OR ORTHERWISE, BUT AN ADULT MUST ACCOMPANY YOU HOME AND STAY WITH YOU FOR 24 HOURS.  Name and phone number of your driver:George Gero [phone]  Special Instructions: N/A              Please read over the following fact sheets you were given: _____________________________________________________________________             [facility] - Preparing for Surgery Before surgery, you can play an important role.  Because skin is not sterile, your skin needs to be as  free of germs as possible.  You can reduce the number of germs on your skin by washing with CHG (chlorahexidine gluconate) soap before surgery.  CHG is an antiseptic cleaner which kills germs and bonds with the skin to continue killing germs even after washing. Please DO NOT use if you have an allergy to CHG or antibacterial soaps.  If your skin becomes reddened/irritated stop using the CHG and inform your nurse when you arrive at Short Stay. Do not shave (including legs and underarms) for at least 48 hours prior to the first CHG shower.  You may shave your face/neck. Please follow these instructions carefully:  1.  Shower with CHG Soap the night before surgery and the  morning of Surgery.  2.  If you choose to wash your hair, wash your hair first as usual  with your  normal  shampoo.  3.  After you shampoo, rinse your hair and body thoroughly to remove the  shampoo.                           4.  Use CHG as you would any other liquid soap.  You can apply chg directly  to the skin and wash                       Gently with a scrungie or clean washcloth.  5.  Apply the CHG Soap to your body ONLY FROM THE NECK DOWN.   Do not use on face/ open                           Wound or open sores. Avoid contact with eyes, ears mouth and genitals (private parts).                       Wash face,  Genitals (private parts) with your normal soap.             6.  Wash thoroughly, paying special attention to the area where your surgery  will be performed.  7.  Thoroughly rinse your body with warm water from the neck down.  8.  DO NOT shower/wash with your normal soap after using and rinsing off  the CHG Soap.                9.  Pat yourself dry with a clean towel.            10.  Wear clean pajamas.            11.  Place clean sheets on your bed the night of your first shower and do not  sleep with pets. Day of Surgery : Do not apply any lotions/deodorants the morning of surgery.  Please wear clean clothes to the  hospital/surgery center.  FAILURE TO FOLLOW THESE INSTRUCTIONS MAY RESULT IN THE CANCELLATION OF YOUR SURGERY PATIENT SIGNATURE_________________________________  NURSE SIGNATURE__________________________________  ________________________________________________________________________

## 2019-10-12 NOTE — Progress Notes (Signed)
Please place surgery orders. Pt scheduled for her PAT appt on 10-13-19. Thank you.

## 2019-10-13 ENCOUNTER — Other Ambulatory Visit: Payer: Self-pay

## 2019-10-13 ENCOUNTER — Encounter (HOSPITAL_COMMUNITY)
Admission: RE | Admit: 2019-10-13 | Discharge: 2019-10-13 | Disposition: A | Discharge status: Home / Self Care | Payer: Medicare PPO | Source: Ambulatory Visit | Attending: Orthopedic Surgery | Admitting: Orthopedic Surgery

## 2019-10-13 ENCOUNTER — Encounter (HOSPITAL_COMMUNITY): Payer: Self-pay

## 2019-10-13 DIAGNOSIS — Z01812 Encounter for preprocedural laboratory examination: Secondary | ICD-10-CM | POA: Diagnosis present

## 2019-10-13 LAB — BASIC METABOLIC PANEL
Anion gap: 9 (ref 5–15)
BUN: 11 mg/dL (ref 8–23)
CO2: 28 mmol/L (ref 22–32)
Calcium: 9.6 mg/dL (ref 8.9–10.3)
Chloride: 103 mmol/L (ref 98–111)
Creatinine, Ser: 0.61 mg/dL (ref 0.44–1.00)
GFR calc Af Amer: >60 mL/min (ref >60)
GFR calc non Af Amer: >60 mL/min (ref >60)
Glucose, Bld: 102 mg/dL — ABNORMAL HIGH (ref 70–99)
Potassium: 3.8 mmol/L (ref 3.5–5.1)
Sodium: 140 mmol/L (ref 135–145)

## 2019-10-13 LAB — CBC
HCT: 43.6 % (ref 36.0–46.0)
Hemoglobin: 14.2 g/dL (ref 12.0–15.0)
MCH: 27.6 pg (ref 26.0–34.0)
MCHC: 32.6 g/dL (ref 30.0–36.0)
MCV: 84.8 fL (ref 80.0–100.0)
Platelets: 341 10*3/uL (ref 150–400)
RBC: 5.14 MIL/uL — ABNORMAL HIGH (ref 3.87–5.11)
RDW: 13.2 % (ref 11.5–15.5)
WBC: 7.2 10*3/uL (ref 4.0–10.5)
nRBC: 0 % (ref 0.0–0.2)

## 2019-10-13 LAB — SURGICAL PCR SCREEN
MRSA, PCR: NEGATIVE
Staphylococcus aureus: NEGATIVE

## 2019-10-14 ENCOUNTER — Other Ambulatory Visit: Payer: Self-pay | Admitting: Orthopedic Surgery

## 2019-10-14 NOTE — Progress Notes (Signed)
Called Dr Luiz Blare office to request orders for 10/15/19 surgery.

## 2019-10-15 ENCOUNTER — Ambulatory Visit (HOSPITAL_COMMUNITY): Payer: Medicare PPO | Admitting: Physician Assistant

## 2019-10-15 ENCOUNTER — Encounter (HOSPITAL_COMMUNITY)
Admission: RE | Disposition: A | Discharge status: Home / Self Care | Payer: Self-pay | Source: Home / Self Care | Attending: Orthopedic Surgery

## 2019-10-15 ENCOUNTER — Encounter (HOSPITAL_COMMUNITY): Payer: Self-pay | Admitting: Orthopedic Surgery

## 2019-10-15 ENCOUNTER — Ambulatory Visit (HOSPITAL_COMMUNITY)
Admission: RE | Admit: 2019-10-15 | Discharge: 2019-10-15 | Disposition: A | Discharge status: Home / Self Care | Payer: Medicare PPO | Attending: Orthopedic Surgery | Admitting: Orthopedic Surgery

## 2019-10-15 ENCOUNTER — Ambulatory Visit (HOSPITAL_COMMUNITY): Payer: Medicare PPO

## 2019-10-15 ENCOUNTER — Other Ambulatory Visit: Payer: Self-pay

## 2019-10-15 DIAGNOSIS — M1611 Unilateral primary osteoarthritis, right hip: Secondary | ICD-10-CM | POA: Diagnosis present

## 2019-10-15 DIAGNOSIS — Z90710 Acquired absence of both cervix and uterus: Secondary | ICD-10-CM | POA: Insufficient documentation

## 2019-10-15 DIAGNOSIS — Z79899 Other long term (current) drug therapy: Secondary | ICD-10-CM | POA: Diagnosis not present

## 2019-10-15 DIAGNOSIS — K219 Gastro-esophageal reflux disease without esophagitis: Secondary | ICD-10-CM | POA: Diagnosis not present

## 2019-10-15 DIAGNOSIS — Z9842 Cataract extraction status, left eye: Secondary | ICD-10-CM | POA: Insufficient documentation

## 2019-10-15 DIAGNOSIS — Z791 Long term (current) use of non-steroidal anti-inflammatories (NSAID): Secondary | ICD-10-CM | POA: Insufficient documentation

## 2019-10-15 DIAGNOSIS — Z9841 Cataract extraction status, right eye: Secondary | ICD-10-CM | POA: Insufficient documentation

## 2019-10-15 DIAGNOSIS — R7303 Prediabetes: Secondary | ICD-10-CM | POA: Insufficient documentation

## 2019-10-15 DIAGNOSIS — K589 Irritable bowel syndrome without diarrhea: Secondary | ICD-10-CM | POA: Insufficient documentation

## 2019-10-15 DIAGNOSIS — M19011 Primary osteoarthritis, right shoulder: Secondary | ICD-10-CM | POA: Diagnosis not present

## 2019-10-15 DIAGNOSIS — Z8489 Family history of other specified conditions: Secondary | ICD-10-CM | POA: Insufficient documentation

## 2019-10-15 DIAGNOSIS — E669 Obesity, unspecified: Secondary | ICD-10-CM | POA: Insufficient documentation

## 2019-10-15 DIAGNOSIS — Z419 Encounter for procedure for purposes other than remedying health state, unspecified: Secondary | ICD-10-CM

## 2019-10-15 DIAGNOSIS — Z8249 Family history of ischemic heart disease and other diseases of the circulatory system: Secondary | ICD-10-CM | POA: Insufficient documentation

## 2019-10-15 DIAGNOSIS — I10 Essential (primary) hypertension: Secondary | ICD-10-CM | POA: Insufficient documentation

## 2019-10-15 DIAGNOSIS — M19012 Primary osteoarthritis, left shoulder: Secondary | ICD-10-CM | POA: Insufficient documentation

## 2019-10-15 DIAGNOSIS — M170 Bilateral primary osteoarthritis of knee: Secondary | ICD-10-CM | POA: Insufficient documentation

## 2019-10-15 DIAGNOSIS — R2689 Other abnormalities of gait and mobility: Secondary | ICD-10-CM | POA: Insufficient documentation

## 2019-10-15 DIAGNOSIS — Z01811 Encounter for preprocedural respiratory examination: Secondary | ICD-10-CM

## 2019-10-15 DIAGNOSIS — Z833 Family history of diabetes mellitus: Secondary | ICD-10-CM | POA: Diagnosis not present

## 2019-10-15 DIAGNOSIS — Z6832 Body mass index (BMI) 32.0-32.9, adult: Secondary | ICD-10-CM | POA: Insufficient documentation

## 2019-10-15 DIAGNOSIS — Z8349 Family history of other endocrine, nutritional and metabolic diseases: Secondary | ICD-10-CM | POA: Diagnosis not present

## 2019-10-15 DIAGNOSIS — Z8711 Personal history of peptic ulcer disease: Secondary | ICD-10-CM | POA: Insufficient documentation

## 2019-10-15 HISTORY — PX: TOTAL HIP ARTHROPLASTY: SHX124

## 2019-10-15 LAB — CBC WITH DIFFERENTIAL/PLATELET
Abs Immature Granulocytes: 0.02 10*3/uL (ref 0.00–0.07)
Basophils Absolute: 0.1 10*3/uL (ref 0.0–0.1)
Basophils Relative: 1 %
Eosinophils Absolute: 0.2 10*3/uL (ref 0.0–0.5)
Eosinophils Relative: 3 %
HCT: 39.9 % (ref 36.0–46.0)
Hemoglobin: 13.4 g/dL (ref 12.0–15.0)
Immature Granulocytes: 0 %
Lymphocytes Relative: 17 %
Lymphs Abs: 1.1 10*3/uL (ref 0.7–4.0)
MCH: 28.3 pg (ref 26.0–34.0)
MCHC: 33.6 g/dL (ref 30.0–36.0)
MCV: 84.2 fL (ref 80.0–100.0)
Monocytes Absolute: 0.5 10*3/uL (ref 0.1–1.0)
Monocytes Relative: 7 %
Neutro Abs: 4.9 10*3/uL (ref 1.7–7.7)
Neutrophils Relative %: 72 %
Platelets: 305 10*3/uL (ref 150–400)
RBC: 4.74 MIL/uL (ref 3.87–5.11)
RDW: 13.1 % (ref 11.5–15.5)
WBC: 6.8 10*3/uL (ref 4.0–10.5)
nRBC: 0 % (ref 0.0–0.2)

## 2019-10-15 LAB — APTT: aPTT: 39 s — ABNORMAL HIGH (ref 24–36)

## 2019-10-15 LAB — PROTIME-INR
INR: 1 (ref 0.8–1.2)
Prothrombin Time: 13.5 seconds (ref 11.4–15.2)

## 2019-10-15 LAB — COMPREHENSIVE METABOLIC PANEL
ALT: 18 U/L (ref 0–44)
AST: 19 U/L (ref 15–41)
Albumin: 4.3 g/dL (ref 3.5–5.0)
Alkaline Phosphatase: 74 U/L (ref 38–126)
Anion gap: 10 (ref 5–15)
BUN: 10 mg/dL (ref 8–23)
CO2: 24 mmol/L (ref 22–32)
Calcium: 9.1 mg/dL (ref 8.9–10.3)
Chloride: 104 mmol/L (ref 98–111)
Creatinine, Ser: 0.66 mg/dL (ref 0.44–1.00)
GFR calc Af Amer: >60 mL/min (ref >60)
GFR calc non Af Amer: >60 mL/min (ref >60)
Glucose, Bld: 106 mg/dL — ABNORMAL HIGH (ref 70–99)
Potassium: 3.6 mmol/L (ref 3.5–5.1)
Sodium: 138 mmol/L (ref 135–145)
Total Bilirubin: 0.8 mg/dL (ref 0.3–1.2)
Total Protein: 7.5 g/dL (ref 6.5–8.1)

## 2019-10-15 LAB — TYPE AND SCREEN
ABO/RH(D): O POS
Antibody Screen: NEGATIVE

## 2019-10-15 SURGERY — ARTHROPLASTY, HIP, TOTAL, ANTERIOR APPROACH
Anesthesia: Spinal | Site: Hip | Laterality: Right

## 2019-10-15 MED ORDER — FENTANYL CITRATE (PF) 100 MCG/2ML IJ SOLN
INTRAMUSCULAR | Status: AC
  Filled 2019-10-15: qty 4

## 2019-10-15 MED ORDER — PROPOFOL 500 MG/50ML IV EMUL
INTRAVENOUS | Status: AC
  Filled 2019-10-15: qty 50

## 2019-10-15 MED ORDER — MEPERIDINE HCL 50 MG/ML IJ SOLN: 6.2500 mg | INTRAMUSCULAR | Status: DC | PRN

## 2019-10-15 MED ORDER — HYDROMORPHONE HCL 1 MG/ML IJ SOLN
0.5000 mg | INTRAMUSCULAR | Status: DC | PRN
  Administered 2019-10-15: 1 mg via INTRAVENOUS

## 2019-10-15 MED ORDER — OXYCODONE-ACETAMINOPHEN 5-325 MG PO TABS: 1.0000 | ORAL_TABLET | Freq: Four times a day (QID) | ORAL | 0 refills | Status: DC | PRN

## 2019-10-15 MED ORDER — CEFAZOLIN SODIUM-DEXTROSE 2-4 GM/100ML-% IV SOLN
INTRAVENOUS | Status: AC
  Administered 2019-10-15: 2 g via INTRAVENOUS
  Filled 2019-10-15: qty 100

## 2019-10-15 MED ORDER — BUPIVACAINE HCL (PF) 0.25 % IJ SOLN
INTRAMUSCULAR | Status: DC | PRN
  Administered 2019-10-15: 30 mL

## 2019-10-15 MED ORDER — LACTATED RINGERS IV SOLN: INTRAVENOUS | Status: DC

## 2019-10-15 MED ORDER — HYDROMORPHONE HCL 1 MG/ML IJ SOLN
INTRAMUSCULAR | Status: AC
  Filled 2019-10-15: qty 1

## 2019-10-15 MED ORDER — LACTATED RINGERS IV BOLUS
500.0000 mL | Freq: Once | INTRAVENOUS | Status: AC
  Administered 2019-10-15: 500 mL via INTRAVENOUS

## 2019-10-15 MED ORDER — TRANEXAMIC ACID-NACL 1000-0.7 MG/100ML-% IV SOLN
1000.0000 mg | INTRAVENOUS | Status: AC
  Administered 2019-10-15: 1000 mg via INTRAVENOUS
  Filled 2019-10-15: qty 100

## 2019-10-15 MED ORDER — MIDAZOLAM HCL 2 MG/2ML IJ SOLN
INTRAMUSCULAR | Status: AC
  Filled 2019-10-15: qty 2

## 2019-10-15 MED ORDER — CHLORHEXIDINE GLUCONATE 4 % EX LIQD: 60.0000 mL | Freq: Once | CUTANEOUS | Status: DC

## 2019-10-15 MED ORDER — ACETAMINOPHEN 325 MG PO TABS: 325.0000 mg | ORAL_TABLET | Freq: Four times a day (QID) | ORAL | Status: DC | PRN

## 2019-10-15 MED ORDER — PROPOFOL 500 MG/50ML IV EMUL: INTRAVENOUS | Status: DC | PRN

## 2019-10-15 MED ORDER — MIDAZOLAM HCL 5 MG/5ML IJ SOLN
INTRAMUSCULAR | Status: DC | PRN
  Administered 2019-10-15: 2 mg via INTRAVENOUS

## 2019-10-15 MED ORDER — TRANEXAMIC ACID-NACL 1000-0.7 MG/100ML-% IV SOLN: 1000.0000 mg | Freq: Once | INTRAVENOUS | Status: AC

## 2019-10-15 MED ORDER — METHOCARBAMOL 500 MG PO TABS: 500.0000 mg | ORAL_TABLET | Freq: Four times a day (QID) | ORAL | Status: DC | PRN

## 2019-10-15 MED ORDER — DOCUSATE SODIUM 100 MG PO CAPS
100.0000 mg | ORAL_CAPSULE | Freq: Two times a day (BID) | ORAL | Status: DC
  Filled 2019-10-15: qty 1

## 2019-10-15 MED ORDER — ASPIRIN EC 325 MG PO TBEC: 325.0000 mg | DELAYED_RELEASE_TABLET | Freq: Two times a day (BID) | ORAL | Status: DC

## 2019-10-15 MED ORDER — ONDANSETRON HCL 4 MG/2ML IJ SOLN
INTRAMUSCULAR | Status: DC | PRN
  Administered 2019-10-15: 4 mg via INTRAVENOUS

## 2019-10-15 MED ORDER — FENTANYL CITRATE (PF) 100 MCG/2ML IJ SOLN
INTRAMUSCULAR | Status: DC | PRN
  Administered 2019-10-15: 50 ug via INTRAVENOUS

## 2019-10-15 MED ORDER — ASPIRIN EC 325 MG PO TBEC: 325.0000 mg | DELAYED_RELEASE_TABLET | Freq: Two times a day (BID) | ORAL | 0 refills | Status: DC

## 2019-10-15 MED ORDER — FENTANYL CITRATE (PF) 100 MCG/2ML IJ SOLN
INTRAMUSCULAR | Status: AC
  Filled 2019-10-15: qty 2

## 2019-10-15 MED ORDER — PHENYLEPHRINE HCL-NACL 10-0.9 MG/250ML-% IV SOLN
INTRAVENOUS | Status: DC | PRN
  Administered 2019-10-15: 50 ug/min via INTRAVENOUS

## 2019-10-15 MED ORDER — OXYCODONE HCL 5 MG PO TABS
ORAL_TABLET | ORAL | Status: AC
  Filled 2019-10-15: qty 1

## 2019-10-15 MED ORDER — FENTANYL CITRATE (PF) 100 MCG/2ML IJ SOLN
25.0000 ug | INTRAMUSCULAR | Status: DC | PRN
  Administered 2019-10-15 (×3): 50 ug via INTRAVENOUS

## 2019-10-15 MED ORDER — ACETAMINOPHEN 325 MG PO TABS: 325.0000 mg | ORAL_TABLET | ORAL | Status: DC | PRN

## 2019-10-15 MED ORDER — OXYCODONE HCL 5 MG/5ML PO SOLN: 5.0000 mg | Freq: Once | ORAL | Status: DC | PRN

## 2019-10-15 MED ORDER — 0.9 % SODIUM CHLORIDE (POUR BTL) OPTIME
TOPICAL | Status: DC | PRN
  Administered 2019-10-15: 1000 mL

## 2019-10-15 MED ORDER — BUPIVACAINE HCL (PF) 0.25 % IJ SOLN
INTRAMUSCULAR | Status: AC
  Filled 2019-10-15: qty 30

## 2019-10-15 MED ORDER — POVIDONE-IODINE 10 % EX SWAB
2.0000 "application " | Freq: Once | CUTANEOUS | Status: AC
  Administered 2019-10-15: 2 via TOPICAL

## 2019-10-15 MED ORDER — MEPIVACAINE HCL (PF) 2 % IJ SOLN
INTRAMUSCULAR | Status: DC | PRN
  Administered 2019-10-15: 60 mg via INTRATHECAL

## 2019-10-15 MED ORDER — DEXAMETHASONE SODIUM PHOSPHATE 10 MG/ML IJ SOLN: 10.0000 mg | Freq: Once | INTRAMUSCULAR | Status: DC

## 2019-10-15 MED ORDER — ONDANSETRON HCL 4 MG/2ML IJ SOLN
4.0000 mg | Freq: Four times a day (QID) | INTRAMUSCULAR | Status: DC | PRN
  Administered 2019-10-15: 4 mg via INTRAVENOUS

## 2019-10-15 MED ORDER — ALUM & MAG HYDROXIDE-SIMETH 200-200-20 MG/5ML PO SUSP
30.0000 mL | ORAL | Status: DC | PRN
  Filled 2019-10-15: qty 30

## 2019-10-15 MED ORDER — KETOROLAC TROMETHAMINE 15 MG/ML IJ SOLN
15.0000 mg | Freq: Once | INTRAMUSCULAR | Status: AC
  Administered 2019-10-15: 15 mg via INTRAVENOUS

## 2019-10-15 MED ORDER — GABAPENTIN 300 MG PO CAPS: 300.0000 mg | ORAL_CAPSULE | Freq: Two times a day (BID) | ORAL | Status: DC

## 2019-10-15 MED ORDER — PROPOFOL 10 MG/ML IV BOLUS
INTRAVENOUS | Status: AC
  Filled 2019-10-15: qty 20

## 2019-10-15 MED ORDER — METHOCARBAMOL 500 MG IVPB - SIMPLE MED
INTRAVENOUS | Status: AC
  Filled 2019-10-15: qty 50

## 2019-10-15 MED ORDER — METHOCARBAMOL 500 MG IVPB - SIMPLE MED
500.0000 mg | Freq: Four times a day (QID) | INTRAVENOUS | Status: DC | PRN
  Administered 2019-10-15: 500 mg via INTRAVENOUS

## 2019-10-15 MED ORDER — PROMETHAZINE HCL 25 MG/ML IJ SOLN: 6.2500 mg | INTRAMUSCULAR | Status: DC | PRN

## 2019-10-15 MED ORDER — KETOROLAC TROMETHAMINE 15 MG/ML IJ SOLN
INTRAMUSCULAR | Status: AC
  Filled 2019-10-15: qty 1

## 2019-10-15 MED ORDER — PROPOFOL 500 MG/50ML IV EMUL
INTRAVENOUS | Status: DC | PRN
  Administered 2019-10-15: 75 ug/kg/min via INTRAVENOUS

## 2019-10-15 MED ORDER — OXYCODONE HCL 5 MG PO TABS
ORAL_TABLET | ORAL | Status: AC
  Administered 2019-10-15: 5 mg via ORAL
  Filled 2019-10-15: qty 1

## 2019-10-15 MED ORDER — OXYCODONE HCL 5 MG PO TABS: 5.0000 mg | ORAL_TABLET | Freq: Once | ORAL | Status: DC | PRN

## 2019-10-15 MED ORDER — BUPIVACAINE LIPOSOME 1.3 % IJ SUSP
INTRAMUSCULAR | Status: DC | PRN
  Administered 2019-10-15: 10 mL

## 2019-10-15 MED ORDER — DEXAMETHASONE SODIUM PHOSPHATE 10 MG/ML IJ SOLN
INTRAMUSCULAR | Status: DC | PRN
  Administered 2019-10-15: 10 mg via INTRAVENOUS

## 2019-10-15 MED ORDER — SODIUM CHLORIDE 0.9 % IV SOLN: INTRAVENOUS | Status: DC

## 2019-10-15 MED ORDER — OXYCODONE HCL 5 MG PO TABS
5.0000 mg | ORAL_TABLET | ORAL | Status: DC | PRN
  Administered 2019-10-15: 5 mg via ORAL

## 2019-10-15 MED ORDER — BUPIVACAINE LIPOSOME 1.3 % IJ SUSP
10.0000 mL | Freq: Once | INTRAMUSCULAR | Status: DC
  Filled 2019-10-15: qty 10

## 2019-10-15 MED ORDER — DIPHENHYDRAMINE HCL 12.5 MG/5ML PO ELIX
12.5000 mg | ORAL_SOLUTION | ORAL | Status: DC | PRN
  Filled 2019-10-15: qty 10

## 2019-10-15 MED ORDER — ACETAMINOPHEN 160 MG/5ML PO SOLN: 325.0000 mg | ORAL | Status: DC | PRN

## 2019-10-15 MED ORDER — LACTATED RINGERS IV BOLUS
250.0000 mL | Freq: Once | INTRAVENOUS | Status: AC
  Administered 2019-10-15: 250 mL via INTRAVENOUS

## 2019-10-15 MED ORDER — PROPOFOL 500 MG/50ML IV EMUL
INTRAVENOUS | Status: DC | PRN
  Administered 2019-10-15 (×2): 20 mg via INTRAVENOUS

## 2019-10-15 MED ORDER — ONDANSETRON HCL 4 MG/2ML IJ SOLN
INTRAMUSCULAR | Status: AC
  Filled 2019-10-15: qty 2

## 2019-10-15 MED ORDER — CEFAZOLIN SODIUM-DEXTROSE 2-4 GM/100ML-% IV SOLN
2.0000 g | INTRAVENOUS | Status: AC
  Administered 2019-10-15: 2 g via INTRAVENOUS
  Filled 2019-10-15: qty 100

## 2019-10-15 MED ORDER — DEXAMETHASONE SODIUM PHOSPHATE 10 MG/ML IJ SOLN
INTRAMUSCULAR | Status: AC
  Filled 2019-10-15: qty 1

## 2019-10-15 MED ORDER — CEFAZOLIN SODIUM-DEXTROSE 2-4 GM/100ML-% IV SOLN: 2.0000 g | Freq: Four times a day (QID) | INTRAVENOUS | Status: DC

## 2019-10-15 MED ORDER — DOCUSATE SODIUM 100 MG PO CAPS: 100.0000 mg | ORAL_CAPSULE | Freq: Two times a day (BID) | ORAL | 0 refills | Status: AC

## 2019-10-15 MED ORDER — TIZANIDINE HCL 2 MG PO TABS: 2.0000 mg | ORAL_TABLET | Freq: Three times a day (TID) | ORAL | 0 refills | Status: DC | PRN

## 2019-10-15 MED ORDER — TRANEXAMIC ACID-NACL 1000-0.7 MG/100ML-% IV SOLN
INTRAVENOUS | Status: AC
  Administered 2019-10-15: 1000 mg via INTRAVENOUS
  Filled 2019-10-15: qty 100

## 2019-10-15 MED ORDER — WATER FOR IRRIGATION, STERILE IR SOLN
Status: DC | PRN
  Administered 2019-10-15: 2000 mL

## 2019-10-15 MED ORDER — ONDANSETRON HCL 4 MG PO TABS: 4.0000 mg | ORAL_TABLET | Freq: Four times a day (QID) | ORAL | Status: DC | PRN

## 2019-10-15 SURGICAL SUPPLY — 44 items
APL SKNCLS STERI-STRIP NONHPOA (GAUZE/BANDAGES/DRESSINGS) ×2
BAG SPEC THK2 15X12 ZIP CLS (MISCELLANEOUS)
BAG ZIPLOCK 12X15 (MISCELLANEOUS) IMPLANT
BENZOIN TINCTURE PRP APPL 2/3 (GAUZE/BANDAGES/DRESSINGS) ×4 IMPLANT
BLADE SAW SGTL 18X1.27X75 (BLADE) ×2 IMPLANT
BLADE SAW SGTL 18X1.27X75MM (BLADE) ×1
BLADE SURG SZ10 CARB STEEL (BLADE) ×6 IMPLANT
CLOSURE STERI-STRIP 1/2X4 (GAUZE/BANDAGES/DRESSINGS) ×1
CLOSURE WOUND 1/2 X4 (GAUZE/BANDAGES/DRESSINGS) ×1
CLSR STERI-STRIP ANTIMIC 1/2X4 (GAUZE/BANDAGES/DRESSINGS) ×1 IMPLANT
COVER PERINEAL POST (MISCELLANEOUS) ×3 IMPLANT
COVER SURGICAL LIGHT HANDLE (MISCELLANEOUS) ×3 IMPLANT
COVER WAND RF STERILE (DRAPES) ×2 IMPLANT
CUP GRIPTON 48MM 100 HIP (Hips) ×2 IMPLANT
DECANTER SPIKE VIAL GLASS SM (MISCELLANEOUS) ×3 IMPLANT
DRAPE STERI IOBAN 125X83 (DRAPES) ×3 IMPLANT
DRAPE U-SHAPE 47X51 STRL (DRAPES) ×6 IMPLANT
DRSG AQUACEL AG ADV 3.5X 6 (GAUZE/BANDAGES/DRESSINGS) ×3 IMPLANT
DURAPREP 26ML APPLICATOR (WOUND CARE) ×3 IMPLANT
ELECT BLADE TIP CTD 4 INCH (ELECTRODE) ×3 IMPLANT
ELECT REM PT RETURN 15FT ADLT (MISCELLANEOUS) ×3 IMPLANT
ELIMINATOR HOLE APEX DEPUY (Hips) ×2 IMPLANT
GAUZE XEROFORM 1X8 LF (GAUZE/BANDAGES/DRESSINGS) IMPLANT
GLOVE BIOGEL PI IND STRL 8 (GLOVE) ×2 IMPLANT
GLOVE BIOGEL PI INDICATOR 8 (GLOVE) ×4
GLOVE ECLIPSE 7.5 STRL STRAW (GLOVE) ×6 IMPLANT
GOWN STRL REUS W/TWL XL LVL3 (GOWN DISPOSABLE) ×6 IMPLANT
HEAD FEMORAL 32 CERAMIC (Hips) ×2 IMPLANT
HOLDER FOLEY CATH W/STRAP (MISCELLANEOUS) ×1 IMPLANT
HOOD PEEL AWAY FLYTE STAYCOOL (MISCELLANEOUS) ×6 IMPLANT
KIT TURNOVER KIT A (KITS) IMPLANT
LINER ACET 32X48 (Liner) ×2 IMPLANT
NEEDLE HYPO 22GX1.5 SAFETY (NEEDLE) ×3 IMPLANT
PACK ANTERIOR HIP CUSTOM (KITS) ×3 IMPLANT
PENCIL SMOKE EVACUATOR (MISCELLANEOUS) ×2 IMPLANT
STAPLER VISISTAT 35W (STAPLE) IMPLANT
STEM CORAIL KA10 (Stem) ×2 IMPLANT
STRIP CLOSURE SKIN 1/2X4 (GAUZE/BANDAGES/DRESSINGS) ×1 IMPLANT
SUT ETHIBOND NAB CT1 #1 30IN (SUTURE) ×6 IMPLANT
SUT MNCRL AB 3-0 PS2 18 (SUTURE) IMPLANT
SUT VIC AB 0 CT1 36 (SUTURE) ×5 IMPLANT
SUT VIC AB 1 CT1 36 (SUTURE) ×3 IMPLANT
TRAY FOLEY MTR SLVR 16FR STAT (SET/KITS/TRAYS/PACK) ×1 IMPLANT
YANKAUER SUCT BULB TIP 10FT TU (MISCELLANEOUS) ×3 IMPLANT

## 2019-10-15 NOTE — Evaluation (Signed)
Physical Therapy Evaluation Patient Details Name: Lori [name] MRN: [id number] DOB: 1951/01/29 Today's Date: 10/15/2019   History of Present Illness  Patient is 69 y.o. female s/p Rt THA anterior approach with PMH significant for HTN, obesity, GERD, and OA.  Clinical Impression  Lori [name] is a 69 y.o. female POD 0 s/p Rt THA anterior appraoch. Patient reports independence with mobility at baseline. Patient is now limited by functional impairments (see PT problem list below) and requires supervision for transfers and gait with RW. Patient was initially limited by nausea on first session and by nausea/vomitting at start of second session. Patient BP remained stable and after 1 bout of emesis pt reported resolution of symptoms and was able to complete all mobility. Patient was able to ambulate ~85 feet with RW and supervision and cues for safe walker management. Patient educated on safe sequencing for stair mobility and verbalized safe guarding position for people assisting with mobility. Patient instructed in exercises to facilitate ROM and circulation. Patient will benefit from continued skilled PT interventions to address impairments and progress towards PLOF. Patient has met mobility goals at adequate level for discharge home; will continue to follow if pt continues acute stay to progress towards Mod I goals.     Follow Up Recommendations Follow surgeon's recommendation for DC plan and follow-up therapies    Equipment Recommendations  Rolling walker with 5" wheels;3in1 (PT)(delivered in PACU)    Recommendations for Other Services       Precautions / Restrictions Precautions Precautions: Fall Restrictions Weight Bearing Restrictions: No      Mobility  Bed Mobility Overal bed mobility: Needs Assistance Bed Mobility: Supine to Sit;Sit to Supine     Supine to sit: HOB elevated;Supervision Sit to supine: HOB elevated;Supervision   General bed mobility comments: cues for use of  gait belt to assist with LE mobilty, no assist required, pt slow to mobilize  Transfers Overall transfer level: Needs assistance Equipment used: Rolling walker (2 wheeled) Transfers: Sit to/from Stand Sit to Stand: Supervision         General transfer comment: cues for safe hand placement and technique with RW, no assist required for power up from EOB or toilet  Ambulation/Gait Ambulation/Gait assistance: Supervision Gait Distance (Feet): 85 Feet Assistive device: Rolling walker (2 wheeled) Gait Pattern/deviations: Step-through pattern Gait velocity: decreased   General Gait Details: cues for safe hand placement, step pattern, and proximity to RW, no overt LOB noted  Stairs Stairs: Yes   Stair Management: No rails;Backwards;With walker;Step to pattern Number of Stairs: 1 General stair comments: cues for safe step pattern with RW, "up with good, down with bad". pt verbalized safe guarding position for person(s) assisting her.  Wheelchair Mobility    Modified Rankin (Stroke Patients Only)       Balance Overall balance assessment: Needs assistance   Sitting balance-Leahy Scale: Good     Standing balance support: Bilateral upper extremity supported;During functional activity Standing balance-Leahy Scale: Fair          Pertinent Vitals/Pain Pain Assessment: Faces Faces Pain Scale: Hurts little more Pain Location: Rt hip Pain Descriptors / Indicators: Aching;Sore Pain Intervention(s): Limited activity within patient's tolerance;Monitored during session;Repositioned    Home Living Family/patient expects to be discharged to:: Private residence Living Arrangements: Spouse/significant other Available Help at Discharge: Family;Available 24 hours/day Type of Home: House Home Access: Stairs to enter Entrance Stairs-Rails: None Entrance Stairs-Number of Steps: 1 Home Layout: One level Home Equipment: Cane - single point  Prior Function Level of Independence:  Independent with assistive device(s)         Comments: pt uses SPC intermittently for pain     Hand Dominance   Dominant Hand: Right    Extremity/Trunk Assessment   Upper Extremity Assessment Upper Extremity Assessment: Overall WFL for tasks assessed    Lower Extremity Assessment Lower Extremity Assessment: Overall WFL for tasks assessed    Cervical / Trunk Assessment Cervical / Trunk Assessment: Normal  Communication   Communication: No difficulties  Cognition Arousal/Alertness: Awake/alert Behavior During Therapy: WFL for tasks assessed/performed Overall Cognitive Status: Within Functional Limits for tasks assessed           General Comments      Exercises Total Joint Exercises Ankle Circles/Pumps: AROM;Both;Supine;10 reps Quad Sets: AROM;5 reps;Supine;Right Short Arc Quad: AROM;5 reps;Supine;Right Heel Slides: AAROM;5 reps;Supine;Right Hip ABduction/ADduction: AROM;5 reps;Supine;Right Long Arc Quad: AROM;5 reps;Right;Seated Other Exercises Other Exercises: Standing Exercise: educated on marching, hip extension, and hip abduction, and knee felxion (instructed to perform at counter for safety)   Assessment/Plan    PT Assessment Patient needs continued PT services  PT Problem List Decreased strength;Decreased balance;Decreased mobility;Decreased range of motion;Decreased knowledge of use of DME;Decreased activity tolerance       PT Treatment Interventions DME instruction;Functional mobility training;Balance training;Patient/family education;Therapeutic activities;Gait training;Stair training;Therapeutic exercise    PT Goals (Current goals can be found in the Care Plan section)  Acute Rehab PT Goals Patient Stated Goal: to get home PT Goal Formulation: With patient Time For Goal Achievement: 10/22/19 Potential to Achieve Goals: Good    Frequency 7X/week    AM-PAC PT "6 Clicks" Mobility  Outcome Measure Help needed turning from your back to your side  while in a flat bed without using bedrails?: A Little Help needed moving from lying on your back to sitting on the side of a flat bed without using bedrails?: A Little Help needed moving to and from a bed to a chair (including a wheelchair)?: A Little Help needed standing up from a chair using your arms (e.g., wheelchair or bedside chair)?: A Little Help needed to walk in hospital room?: A Little Help needed climbing 3-5 steps with a railing? : A Little 6 Click Score: 18    End of Session Equipment Utilized During Treatment: Gait belt Activity Tolerance: Patient tolerated treatment well Patient left: in chair;with call bell/phone within reach Nurse Communication: Mobility status PT Visit Diagnosis: Muscle weakness (generalized) (M62.81);Difficulty in walking, not elsewhere classified (R26.2)    Time: 8250-5397; 6734-1937 PT Time Calculation (min) (ACUTE ONLY): 62 min   Charges:   PT Evaluation $PT Eval Low Complexity: 1 Low PT Treatments $Gait Training: 8-22 mins $Therapeutic Exercise: 8-22 mins $Therapeutic Activity: 8-22 mins         Verner Mould, DPT Physical Therapist with [facility] [phone]  10/15/2019 6:34 PM

## 2019-10-15 NOTE — Anesthesia Preprocedure Evaluation (Addendum)
Anesthesia Evaluation  Patient identified by MRN, date of birth, ID band Patient awake    Reviewed: Allergy & Precautions, NPO status , Patient's Chart, lab work & pertinent test results  Airway Mallampati: I       Dental no notable dental hx. (+) Teeth Intact   Pulmonary neg pulmonary ROS,    Pulmonary exam normal breath sounds clear to auscultation       Cardiovascular hypertension, Pt. on medications Normal cardiovascular exam Rhythm:Regular Rate:Normal     Neuro/Psych negative neurological ROS  negative psych ROS   GI/Hepatic Neg liver ROS, GERD  Medicated and Controlled,  Endo/Other    Renal/GU negative Renal ROS  negative genitourinary   Musculoskeletal  (+) Arthritis , Osteoarthritis,    Abdominal (+) + obese,   Peds  Hematology negative hematology ROS (+)   Anesthesia Other Findings   Reproductive/Obstetrics                             Anesthesia Physical Anesthesia Plan  ASA: II  Anesthesia Plan: Spinal   Post-op Pain Management:    Induction:   PONV Risk Score and Plan: 2 and Propofol infusion  Airway Management Planned: Nasal Cannula, Natural Airway and Simple Face Mask  Additional Equipment: None  Intra-op Plan:   Post-operative Plan:   Informed Consent: I have reviewed the patients History and Physical, chart, labs and discussed the procedure including the risks, benefits and alternatives for the proposed anesthesia with the patient or authorized representative who has indicated his/her understanding and acceptance.       Plan Discussed with: CRNA  Anesthesia Plan Comments:         Anesthesia Quick Evaluation

## 2019-10-15 NOTE — Anesthesia Procedure Notes (Signed)
Spinal  Patient location during procedure: OR Start time: 10/15/2019 9:33 AM End time: 10/15/2019 9:36 AM Staffing Performed: anesthesiologist  Anesthesiologist: Leilani Able, MD Preanesthetic Checklist Completed: patient identified, IV checked, site marked, risks and benefits discussed, surgical consent, monitors and equipment checked, pre-op evaluation and timeout performed Spinal Block Patient position: sitting Prep: DuraPrep and site prepped and draped Patient monitoring: continuous pulse ox and blood pressure Approach: midline Location: L3-4 Injection technique: single-shot Needle Needle type: Pencan  Needle gauge: 24 G Needle length: 10 cm Needle insertion depth: 6 cm Assessment Sensory level: T8

## 2019-10-15 NOTE — Anesthesia Postprocedure Evaluation (Signed)
Anesthesia Post Note  Patient: Lori [name]  Procedure(s) Performed: TOTAL HIP ARTHROPLASTY ANTERIOR APPROACH (Right Hip)     Patient location during evaluation: PACU Anesthesia Type: Spinal Level of consciousness: awake Pain management: pain level not controlled Vital Signs Assessment: post-procedure vital signs reviewed and stable Respiratory status: spontaneous breathing Cardiovascular status: stable Postop Assessment: no headache, no backache, spinal receding, patient able to bend at knees and no apparent nausea or vomiting Anesthetic complications: no    Last Vitals:  Vitals:   10/15/19 1245 10/15/19 1300  BP: 114/75 115/73  Pulse: 70 (!) 58  Resp: 11 13  Temp:  (!) 36.3 C  SpO2: 94% 97%    Last Pain:  Vitals:   10/15/19 1300  TempSrc:   PainSc: 8    Pain Goal: Patients Stated Pain Goal: 3 (10/15/19 0858)  LLE Motor Response: Purposeful movement (10/15/19 1300) LLE Sensation: Decreased (10/15/19 1300) RLE Motor Response: Purposeful movement (10/15/19 1300) RLE Sensation: Decreased (10/15/19 1300) L Sensory Level: S1-Sole of foot, small toes (10/15/19 1300) R Sensory Level: S1-Sole of foot, small toes (10/15/19 1300) Epidural/Spinal Function Patient able to flex knees: Yes (10/15/19 1300)  Caren Macadam

## 2019-10-15 NOTE — Brief Op Note (Signed)
10/15/2019  11:01 AM  PATIENT:  Lori [name]  69 y.o. female  PRE-OPERATIVE DIAGNOSIS:  RIGHT HIP OSTEOARTHRITIS  POST-OPERATIVE DIAGNOSIS:  RIGHT HIP OSTEOARTHRITIS  PROCEDURE:  Procedure(s): TOTAL HIP ARTHROPLASTY ANTERIOR APPROACH (Right)  SURGEON:  Surgeon(s) and Role:    Jodi Geralds, MD - Primary  PHYSICIAN ASSISTANT:   ASSISTANTS: jim bethune   ANESTHESIA:   spinal  EBL:  250 mL   BLOOD ADMINISTERED:none  DRAINS: none   LOCAL MEDICATIONS USED:  MARCAINE    and OTHER experel  SPECIMEN:  No Specimen  DISPOSITION OF SPECIMEN:  N/A  COUNTS:  YES  TOURNIQUET:  * No tourniquets in log *  DICTATION: .Other Dictation: Dictation Number [id number]  PLAN OF CARE: Discharge to home after PACU  PATIENT DISPOSITION:  PACU - hemodynamically stable.   Delay start of Pharmacological VTE agent (>24hrs) due to surgical blood loss or risk of bleeding: no

## 2019-10-15 NOTE — Anesthesia Procedure Notes (Signed)
Date/Time: 10/15/2019 9:26 AM Performed by: Thornell Mule, CRNA Oxygen Delivery Method: Simple face mask

## 2019-10-15 NOTE — Discharge Instructions (Signed)

## 2019-10-15 NOTE — H&P (Signed)
TOTAL HIP ADMISSION H&P  Patient is admitted for right total hip arthroplasty.  Subjective:  Chief Complaint: right hip pain  HPI: Barron SchmidHazel W [name], 69 y.o. female, has a history of pain and functional disability in the right hip(s) due to arthritis and patient has failed non-surgical conservative treatments for greater than 12 weeks to include NSAID's and/or analgesics, use of assistive devices and activity modification.  Onset of symptoms was gradual starting 4 years ago with gradually worsening course since that time.The patient noted no past surgery on the right hip(s).  Patient currently rates pain in the right hip at 8 out of 10 with activity. Patient has night pain, worsening of pain with activity and weight bearing, trendelenberg gait, pain that interfers with activities of daily living and pain with passive range of motion. Patient has evidence of subchondral cysts, subchondral sclerosis, periarticular osteophytes and joint space narrowing by imaging studies. This condition presents safety issues increasing the risk of falls. This patient has had Failure of all reasonable conservative care.  There is no current active infection.  There are no problems to display for this patient.  Past Medical History:  Diagnosis Date  . Arthritis    knees, shoulders  . Cataract    surgery  . Diverticulosis   . Duodenal ulcer   . GERD (gastroesophageal reflux disease)   . H pylori ulcer   . Hypertension   . IBS (irritable bowel syndrome)   . Obesity   . Pre-diabetes     Past Surgical History:  Procedure Laterality Date  . ABDOMINAL HYSTERECTOMY    . BLADDER SUSPENSION    . CATARACT EXTRACTION, BILATERAL    . COLONOSCOPY  06/2019  . KNEE ARTHROSCOPY WITH MEDIAL MENISECTOMY Right 09/18/2016   Procedure: KNEE ARTHROSCOPY WITH MEDIAL MENISCECTOMY, LATERAL MENISCECTOMY, CHONDROPLASTY;  Surgeon: Jodi GeraldsJohn Ninamarie Keel, MD;  Location: [facility];  Service: Orthopedics;  Laterality: Right;  KNEE  ARTHROSCOPY, MEDIAL CHONDROPLASTY, MEDIAL LATERAL MENISCAL TEAR, MEDIAL MENEISCECTOMY, CHONDROPLASTY OF THE PATELLA  . TUBAL LIGATION    . UPPER GASTROINTESTINAL ENDOSCOPY  08/2019  . UTERINE FIBROID SURGERY      No current facility-administered medications for this encounter.   Current Outpatient Medications  Medication Sig Dispense Refill Last Dose  . acetaminophen (TYLENOL) 650 MG CR tablet Take 1,300 mg by mouth every 8 (eight) hours as needed for pain.     . Calcium Carbonate-Vitamin D (CALCIUM 600/VITAMIN D PO) Take 1 tablet by mouth daily.     Lori [name]. Casanthranol-Docusate Sodium 30-100 MG CAPS Take 1 capsule by mouth daily as needed (constipation).      Lori [name]. diclofenac sodium (VOLTAREN) 1 % GEL Apply 2 g topically daily as needed (pain).      Lori [name]. dicyclomine (BENTYL) 20 MG tablet TAKE 1 TABLET(20 MG) BY MOUTH FOUR TIMES DAILY BEFORE MEALS AND AT BEDTIME (Patient taking differently: Take 20 mg by mouth 4 (four) times daily -  before meals and at bedtime. ) 120 tablet 3   . estradiol (ESTRACE) 0.1 MG/GM vaginal cream Place 2 g vaginally 2 (two) times a week.      . estradiol (VIVELLE-DOT) 0.1 MG/24HR Place 1 patch onto the skin 2 (two) times a week.     Lori [name]. lisinopril-hydrochlorothiazide (PRINZIDE,ZESTORETIC) 20-12.5 MG per tablet Take 0.5 tablets by mouth daily.      . MULTIPLE VITAMIN PO Take 1 tablet by mouth daily.      . pantoprazole (PROTONIX) 40 MG tablet TAKE 1 TABLET(40 MG) BY MOUTH TWICE  DAILY (Patient taking differently: Take 40 mg by mouth 2 (two) times daily. ) 112 tablet 3    No Known Allergies  Social History   Tobacco Use  . Smoking status: Never Smoker  . Smokeless tobacco: Never Used  Substance Use Topics  . Alcohol use: No    Family History  Problem Relation Age of Onset  . Hypertension Other   . Hyperlipidemia Other   . Heart disease Other   . Diabetes Other   . Obesity Other   . Colon cancer Neg Hx   . Esophageal cancer Neg Hx   . Stomach cancer Neg Hx   . Rectal  cancer Neg Hx   . Colon polyps Neg Hx      Review of Systems ROS: I have reviewed the patient's review of systems thoroughly and there are no positive responses as relates to the HPI. Objective:  Physical Exam  Vital signs in last 24 hours:   Well-developed well-nourished patient in no acute distress. Alert and oriented x3 HEENT:within normal limits Cardiac: Regular rate and rhythm Pulmonary: Lungs clear to auscultation Abdomen: Soft and nontender.  Normal active bowel sounds  Musculoskeletal: (Right hip: Painful range of motion.  Limited range of motion.  No instability.  Neurovascular intact distally. Labs: Recent Results (from the past 2160 hour(s))  SARS Coronavirus 2 (TAT 6-24 hrs)     Status: None   Collection Time: 09/07/19 12:00 AM  Result Value Ref Range   SARS Coronavirus 2 RESULT:  NEGATIVE     Comment: RESULT:  NEGATIVESARS-CoV-2 INTERPRETATION:A NEGATIVE  test result means that SARS-CoV-2 RNA was not present in the specimen above the limit of detection of this test. This does not preclude a possible SARS-CoV-2 infection and should not be used as the  sole basis for patient management decisions. Negative results must be combined with clinical observations, patient history, and epidemiological information. Optimum specimen types and timing for peak viral levels during infections caused by SARS-CoV-2  have not been determined. Collection of multiple specimens or types of specimens may be necessary to detect virus. Improper specimen collection and handling, sequence variability under primers/probes, or organism present below the limit of detection may  lead to false negative results. Positive and negative predictive values of testing are highly dependent on prevalence. False negative test results are more likely when prevalence of disease is high.The expected result is NEGATIVE.Fact  Sheet for  Healthcare Providers: quierodirigir.com.Fact Sheet for  Patients: [email or web address].[email or web address].Normal Reference Range - Negative   Hemoglobin A1c     Status: Abnormal   Collection Time: 09/28/19  8:05 AM  Result Value Ref Range   Hgb A1c MFr Bld 6.1 (H) 4.8 - 5.6 %    Comment: (NOTE) Pre diabetes:          5.7%-6.4% Diabetes:              >6.4% Glycemic control for   <7.0% adults with diabetes    Mean Plasma Glucose 128.37 mg/dL    Comment: Performed at [facility], [address] [place], [place] [address]  APTT     Status: Abnormal   Collection Time: 09/28/19  8:05 AM  Result Value Ref Range   aPTT 40 (H) 24 - 36 seconds    Comment:        IF BASELINE aPTT IS ELEVATED, SUGGEST PATIENT RISK ASSESSMENT BE USED TO DETERMINE APPROPRIATE ANTICOAGULANT THERAPY. Performed at [facility], [address], [place], [place] [address]  CBC WITH DIFFERENTIAL     Status: None   Collection Time: 09/28/19  8:05 AM  Result Value Ref Range   WBC 6.5 4.0 - 10.5 K/uL   RBC 4.78 3.87 - 5.11 MIL/uL   Hemoglobin 13.6 12.0 - 15.0 g/dL   HCT 41.3 36.0 - 46.0 %   MCV 86.4 80.0 - 100.0 fL   MCH 28.5 26.0 - 34.0 pg   MCHC 32.9 30.0 - 36.0 g/dL   RDW 13.1 11.5 - 15.5 %   Platelets 257 150 - 400 K/uL   nRBC 0.0 0.0 - 0.2 %   Neutrophils Relative % 75 %   Neutro Abs 4.9 1.7 - 7.7 K/uL   Lymphocytes Relative 13 %   Lymphs Abs 0.8 0.7 - 4.0 K/uL   Monocytes Relative 10 %   Monocytes Absolute 0.6 0.1 - 1.0 K/uL   Eosinophils Relative 1 %   Eosinophils Absolute 0.1 0.0 - 0.5 K/uL   Basophils Relative 1 %   Basophils Absolute 0.0 0.0 - 0.1 K/uL   Immature Granulocytes 0 %   Abs Immature Granulocytes 0.02 0.00 - 0.07 K/uL    Comment: Performed at [facility], [place] [address] [place], [place] [address]  Comprehensive metabolic panel     Status: Abnormal   Collection Time: 09/28/19  8:05 AM  Result Value Ref Range   Sodium 138 135 - 145 mmol/L   Potassium 3.6 3.5 - 5.1 mmol/L   Chloride 104  98 - 111 mmol/L   CO2 26 22 - 32 mmol/L   Glucose, Bld 105 (H) 70 - 99 mg/dL   BUN 10 8 - 23 mg/dL   Creatinine, Ser 0.67 0.44 - 1.00 mg/dL   Calcium 8.8 (L) 8.9 - 10.3 mg/dL   Total Protein 6.5 6.5 - 8.1 g/dL   Albumin 3.7 3.5 - 5.0 g/dL   AST 20 15 - 41 U/L   ALT 18 0 - 44 U/L   Alkaline Phosphatase 69 38 - 126 U/L   Total Bilirubin 0.5 0.3 - 1.2 mg/dL   GFR calc non Af Amer >60 >60 mL/min   GFR calc Af Amer >60 >60 mL/min   Anion gap 8 5 - 15    Comment: Performed at [facility], [place] [address] [place], [place] [address]  Protime-INR     Status: None   Collection Time: 09/28/19  8:05 AM  Result Value Ref Range   Prothrombin Time 13.3 11.4 - 15.2 seconds   INR 1.0 0.8 - 1.2    Comment: (NOTE) INR goal varies based on device and disease states. Performed at [facility], [place] [address], [place], [place] [address]   Type and screen Order type and screen if day of surgery is less than 15 days from draw of preadmission visit or order morning of surgery if day of surgery is greater than 6 days from preadmission visit.     Status: None   Collection Time: 09/28/19  8:05 AM  Result Value Ref Range   ABO/RH(D) O POS    Antibody Screen NEG    Sample Expiration 10/04/2019,2359    Extend sample reason      NO TRANSFUSIONS OR PREGNANCY IN THE PAST 3 MONTHS Performed at [facility], [place] [address] [place], [place] [address]   Urinalysis, Routine w reflex microscopic     Status: None   Collection Time: 09/28/19  8:05 AM  Result Value Ref Range   Color,  Urine YELLOW YELLOW   APPearance CLEAR CLEAR   Specific Gravity, Urine 1.011 1.005 - 1.030   pH 6.0 5.0 - 8.0   Glucose, UA NEGATIVE NEGATIVE mg/dL   Hgb urine dipstick NEGATIVE NEGATIVE   Bilirubin Urine NEGATIVE NEGATIVE   Ketones, ur NEGATIVE NEGATIVE mg/dL   Protein, ur NEGATIVE NEGATIVE mg/dL   Nitrite NEGATIVE NEGATIVE   Leukocytes,Ua NEGATIVE NEGATIVE    Comment:  Performed at [facility], [address], [place], [place] [address]  Surgical pcr screen     Status: None   Collection Time: 09/28/19  8:05 AM   Specimen: Nasal Mucosa; Nasal Swab  Result Value Ref Range   MRSA, PCR NEGATIVE NEGATIVE   Staphylococcus aureus NEGATIVE NEGATIVE    Comment: (NOTE) The Xpert SA Assay (FDA approved for NASAL specimens in patients 66 years of age and older), is one component of a comprehensive surveillance program. It is not intended to diagnose infection nor to guide or monitor treatment. Performed at [facility], [address] [place], [place] [address]   ABO/Rh     Status: None   Collection Time: 09/28/19  8:05 AM  Result Value Ref Range   ABO/RH(D)      O POS Performed at [facility], [address] [place], [place] [address]   Novel Coronavirus, NAA [facility] order, Send-out to Ref Lab; TAT 18-24 hrs     Status: Abnormal   Collection Time: 09/28/19  9:15 AM   Specimen: Nasopharyngeal Swab; Respiratory  Result Value Ref Range   SARS-CoV-2, NAA DETECTED (A) NOT DETECTED    Comment: (NOTE)                  Client Requested Flag This nucleic acid amplification test was developed and its performance characteristics determined by [employer]. Nucleic acid amplification tests include PCR and TMA. This test has not been FDA cleared or approved. This test has been authorized by FDA under an Emergency Use Authorization (EUA). This test is only authorized for the duration of time the declaration that circumstances exist justifying the authorization of the emergency use of in vitro diagnostic tests for detection of SARS-CoV-2 virus and/or diagnosis of COVID-19 infection under section 564(b)(1) of the Act, 21 U.S.C. 737TGG-2(I) (1), unless the authorization is terminated or revoked sooner. When diagnostic testing is negative, the possibility of a false negative result should be considered  in the context of a patient's recent exposures and the presence of clinical signs and symptoms consistent with COVID-19. An individual without symptoms of COVID-  19 and who is not shedding SARS-CoV-2 virus would expect to have a negative (not detected) result in this assay. Performed At: [facility] [address] [place], [place] [id number] Jolene Schimke MD JJ:[id number]    Coronavirus Source NASOPHARYNGEAL     Comment: Performed at [facility], [address], [place], [place] [address]  Basic metabolic panel     Status: Abnormal   Collection Time: 10/13/19  1:26 PM  Result Value Ref Range   Sodium 140 135 - 145 mmol/L   Potassium 3.8 3.5 - 5.1 mmol/L   Chloride 103 98 - 111 mmol/L   CO2 28 22 - 32 mmol/L   Glucose, Bld 102 (H) 70 - 99 mg/dL   BUN 11 8 - 23 mg/dL   Creatinine, Ser 6.78 0.44 - 1.00 mg/dL   Calcium 9.6 8.9 - 93.8 mg/dL   GFR calc non Af Amer >60 >  60 mL/min   GFR calc Af Amer >60 >60 mL/min   Anion gap 9 5 - 15    Comment: Performed at [facility], [address], [place], [place] [address]  CBC     Status: Abnormal   Collection Time: 10/13/19  1:26 PM  Result Value Ref Range   WBC 7.2 4.0 - 10.5 K/uL   RBC 5.14 (H) 3.87 - 5.11 MIL/uL   Hemoglobin 14.2 12.0 - 15.0 g/dL   HCT 89.2 11.9 - 41.7 %   MCV 84.8 80.0 - 100.0 fL   MCH 27.6 26.0 - 34.0 pg   MCHC 32.6 30.0 - 36.0 g/dL   RDW 40.8 14.4 - 81.8 %   Platelets 341 150 - 400 K/uL   nRBC 0.0 0.0 - 0.2 %    Comment: Performed at [facility], [address] [place], [place] [address]  Surgical pcr screen     Status: None   Collection Time: 10/13/19  1:26 PM   Specimen: Nasal Mucosa; Nasal Swab  Result Value Ref Range   MRSA, PCR NEGATIVE NEGATIVE   Staphylococcus aureus NEGATIVE NEGATIVE    Comment: (NOTE) The Xpert SA Assay (FDA approved for NASAL specimens in patients 82 years of age and older), is one component of a comprehensive surveillance  program. It is not intended to diagnose infection nor to guide or monitor treatment. Performed at [facility], [address] [place], [place] [address]     Estimated body mass index is 33.23 kg/m as calculated from the following:   Height as of 10/13/19: 5\' 4"  (1.626 m).   Weight as of 10/13/19: 87.8 kg.   Imaging Review Plain radiographs demonstrate severe degenerative joint disease of the right hip(s). The bone quality appears to be fair for age and reported activity level.      Assessment/Plan:  End stage arthritis, right hip(s)  The patient history, physical examination, clinical judgement of the provider and imaging studies are consistent with end stage degenerative joint disease of the right hip(s) and total hip arthroplasty is deemed medically necessary. The treatment options including medical management, injection therapy, arthroscopy and arthroplasty were discussed at length. The risks and benefits of total hip arthroplasty were presented and reviewed. The risks due to aseptic loosening, infection, stiffness, dislocation/subluxation,  thromboembolic complications and other imponderables were discussed.  The patient acknowledged the explanation, agreed to proceed with the plan and consent was signed. Patient is being admitted for inpatient treatment for surgery, pain control, PT, OT, prophylactic antibiotics, VTE prophylaxis, progressive ambulation and ADL's and discharge planning.The patient is planning to be discharged home with home health services

## 2019-10-15 NOTE — Transfer of Care (Signed)
Immediate Anesthesia Transfer of Care Note  Patient: Lori [name]  Procedure(s) Performed: TOTAL HIP ARTHROPLASTY ANTERIOR APPROACH (Right Hip)  Patient Location: PACU  Anesthesia Type:Spinal  Level of Consciousness: drowsy, patient cooperative and responds to stimulation  Airway & Oxygen Therapy: Patient Spontanous Breathing and Patient connected to face mask oxygen  Post-op Assessment: Report given to RN and Post -op Vital signs reviewed and stable  Post vital signs: Reviewed and stable  Last Vitals:  Vitals Value Taken Time  BP 99/61 10/15/19 1137  Temp    Pulse 78 10/15/19 1139  Resp 23 10/15/19 1139  SpO2 100 % 10/15/19 1139  Vitals shown include unvalidated device data.  Last Pain:  Vitals:   10/15/19 0858  TempSrc:   PainSc: 0-No pain      Patients Stated Pain Goal: 3 (10/15/19 1281)  Complications: No apparent anesthesia complications

## 2019-10-16 NOTE — Op Note (Signed)
NAME: Lori [name], Lori W. MEDICAL RECORD [id number] ACCOUNT [id number] DATE OF BIRTH:Sep 13, 1951 FACILITY: [facility] LOCATION: [facility] PHYSICIAN:Telia Amundson L. Hykeem Ojeda, MD  OPERATIVE REPORT  DATE OF PROCEDURE:  10/15/2019  PREOPERATIVE DIAGNOSIS:  End-stage degenerative joint disease, right hip.  POSTOPERATIVE DIAGNOSIS:  End-stage degenerative joint disease, right hip.  PROCEDURE:   1.  Right total hip replacement with a Corail stem size 10, a 48 mm Pinnacle Gription cup, neutral liner, 32 mm +1.5 delta ceramic hip ball.   2.  Interpretation of multiple intraoperative fluoroscopic images.  SURGEON:  Dorna Leitz, MD  ASSISTANT:  Gary Fleet PA-C, who was present for the entire case and assisted by retraction of tissues, manipulation of the leg, and closing to minimize OR time.  BRIEF HISTORY:  The patient is a 69 year old female with a long history of significant complaints of right hip pain.  She has been treated conservatively for a prolonged period of time with failure of all conservative care.  She is taken to the operating  room for right total hip replacement.  She was having night pain and light activity pain, and preoperative x-ray showed severe bone-on-bone change.  DESCRIPTION OF PROCEDURE:  The patient was taken to the operating room after adequate anesthesia was obtained with a spinal anesthetic, placed on the operating table.  The right hip was then prepped and draped in usual sterile fashion.  Following this,  the incision was made for an anterior approach to the hip, with the patient on the Hana bed with all bony prominences well padded.  The subcutaneous tissue was dissected down to the tensor fascia clearly identified and divided in line with its fibers.   The muscle was finger dissected and retractors put in place above and below the neck.  Following this, the vessels were cauterized to the anterior aspect of the neck.  Once this was done, the capsule was opened and tagged.   Anterior capsule was released  completely.  The femoral neck cut provisional was then made.  Retractors were then put in place above and below the acetabulum with the leg externally rotated 60 degrees.  The acetabulum was sequentially reamed to a level of 47 mm.  A 48 mm Pinnacle  Gription cup was then hammered into place, 45 degrees of lateral opening, 30 degrees of anteversion.  Following this, the attention was turned towards the stem side where a cookie cutter was used followed by a chili pepper followed by sequential  broaching up to a level of 9.  It was really tight at 9.  We went ahead and checked it and looked like it still needed to be bigger in the stem, so we broached it up to 10.  The trial reduction was a bit long, which was interesting because we felt like  we had superiorized the acetabulum some, we used a neutral liner without offset, but still she was measuring a bit long.  We knocked the 10 down as far as I thought that I could get it safely and then put the 10 stem in because that was the best fit.  We  then trialed it again with a 1.5 ceramic and it was still a touch long, but I felt like that was going to be the best position for her.  At that point, the 1.5 delta ceramic hip ball was opened and placed.  We then did a final reduction, took final  images.  Excellent leg length, offset, and fit and fill were achieved.  At this point,  the wound was irrigated, suctioned dry.  The capsule was closed with 1 Vicryl running, the tensor fascia was closed with 0 Vicryl running, the skin with 0 and 2-0  Vicryl and 3-0 Monocryl subcuticular.  Benzoin, Steri-Strips, dry sterile compressive dressing was applied.  The patient was taken to recovery was noted to be in satisfactory condition.  Estimated blood loss for procedure was 250 mL.  Final can be gotten  from the anesthetic record.  CN/NUANCE  D:10/15/2019 T:10/16/2019 JOB:[id number]

## 2019-10-18 ENCOUNTER — Encounter: Payer: Self-pay | Admitting: *Deleted

## 2019-11-16 ENCOUNTER — Ambulatory Visit: Payer: Self-pay | Admitting: *Deleted

## 2019-11-16 NOTE — Telephone Encounter (Signed)
Patient called satating that she has had COVID-19 and has appointment scheduled for 2/25. She would like to know if it is appropriate time.  She was Dx Jan 7th. She was informed it s recommened that she wait 45 days for positive test and she is Ok for vaccination that day.  She needed to reschedule because of a previous appointment conflict .  She was rescheduled for 1pm 11/18/19. She was reminded to bring ID and insurance information if available.  She was told to arrive at least 1 minutes early. She verbalized understanding of all information.

## 2019-11-18 ENCOUNTER — Ambulatory Visit: Payer: Medicare PPO

## 2019-11-18 ENCOUNTER — Ambulatory Visit: Payer: Medicare PPO | Attending: Internal Medicine

## 2019-11-18 DIAGNOSIS — Z23 Encounter for immunization: Secondary | ICD-10-CM | POA: Insufficient documentation

## 2019-11-18 NOTE — Progress Notes (Signed)
   Covid-19 Vaccination Clinic  Name:  ZELPHA MESSING    MRN: [id number] DOB: 08-17-1951  11/18/2019  Ms. Sofranko was observed post Covid-19 immunization for 15 minutes without incidence. She was provided with Vaccine Information Sheet and instruction to access the V-Safe system.   Ms. Mccarthy was instructed to call 911 with any severe reactions post vaccine: [name] Difficulty breathing  . Swelling of your face and throat  . A fast heartbeat  . A bad rash all over your body  . Dizziness and weakness    Immunizations Administered    Name Date Dose VIS Date Route   Pfizer COVID-19 Vaccine 11/18/2019 12:54 PM 0.3 mL 09/03/2019 Intramuscular   Manufacturer: [employer], [employer]   Lot: [id number]   NDC: [address]-1

## 2019-12-14 ENCOUNTER — Ambulatory Visit: Payer: Medicare PPO

## 2019-12-15 ENCOUNTER — Ambulatory Visit: Payer: Medicare PPO | Attending: Internal Medicine

## 2019-12-15 DIAGNOSIS — Z23 Encounter for immunization: Secondary | ICD-10-CM

## 2019-12-15 NOTE — Progress Notes (Signed)
   Covid-19 Vaccination Clinic  Name:  [name]    MRN: [id number] DOB: Feb 21, 1951  12/15/2019  [name] was observed post Covid-19 immunization for 15 minutes without incident. She was provided with Vaccine Information Sheet and instruction to access the V-Safe system.   [name] was instructed to call 911 with any severe reactions post vaccine: [name] Difficulty breathing  . Swelling of face and throat  . A fast heartbeat  . A bad rash all over body  . Dizziness and weakness   Immunizations Administered    Name Date Dose VIS Date Route   Pfizer COVID-19 Vaccine 12/15/2019  9:16 AM 0.3 mL 09/03/2019 Intramuscular   Manufacturer: [employer], [employer]   Lot: [id number]   NDC: [address]-3

## 2020-02-07 ENCOUNTER — Other Ambulatory Visit: Payer: Self-pay | Admitting: Gastroenterology

## 2020-06-28 ENCOUNTER — Other Ambulatory Visit: Payer: Self-pay | Admitting: Gastroenterology

## 2020-07-10 ENCOUNTER — Other Ambulatory Visit: Payer: Self-pay | Admitting: Gastroenterology

## 2020-07-10 DIAGNOSIS — Z1159 Encounter for screening for other viral diseases: Secondary | ICD-10-CM

## 2020-08-19 ENCOUNTER — Other Ambulatory Visit: Payer: Self-pay | Admitting: Gastroenterology

## 2020-08-19 DIAGNOSIS — K269 Duodenal ulcer, unspecified as acute or chronic, without hemorrhage or perforation: Secondary | ICD-10-CM

## 2020-09-30 ENCOUNTER — Other Ambulatory Visit: Payer: Self-pay | Admitting: Gastroenterology

## 2020-11-07 DIAGNOSIS — Z961 Presence of intraocular lens: Secondary | ICD-10-CM | POA: Diagnosis not present

## 2020-11-07 DIAGNOSIS — H40013 Open angle with borderline findings, low risk, bilateral: Secondary | ICD-10-CM | POA: Diagnosis not present

## 2020-11-07 DIAGNOSIS — R7309 Other abnormal glucose: Secondary | ICD-10-CM | POA: Diagnosis not present

## 2020-11-07 DIAGNOSIS — H26493 Other secondary cataract, bilateral: Secondary | ICD-10-CM | POA: Diagnosis not present

## 2021-02-03 IMAGING — CR DG CHEST 2V
2 series · 2 of 2 positions shown · non-contrast
Comparison: August 20, 2014

CLINICAL DATA: Preoperative assessment for total hip replacement.
Hypertension.

EXAM:
CHEST - 2 VIEW

[w chest pa]
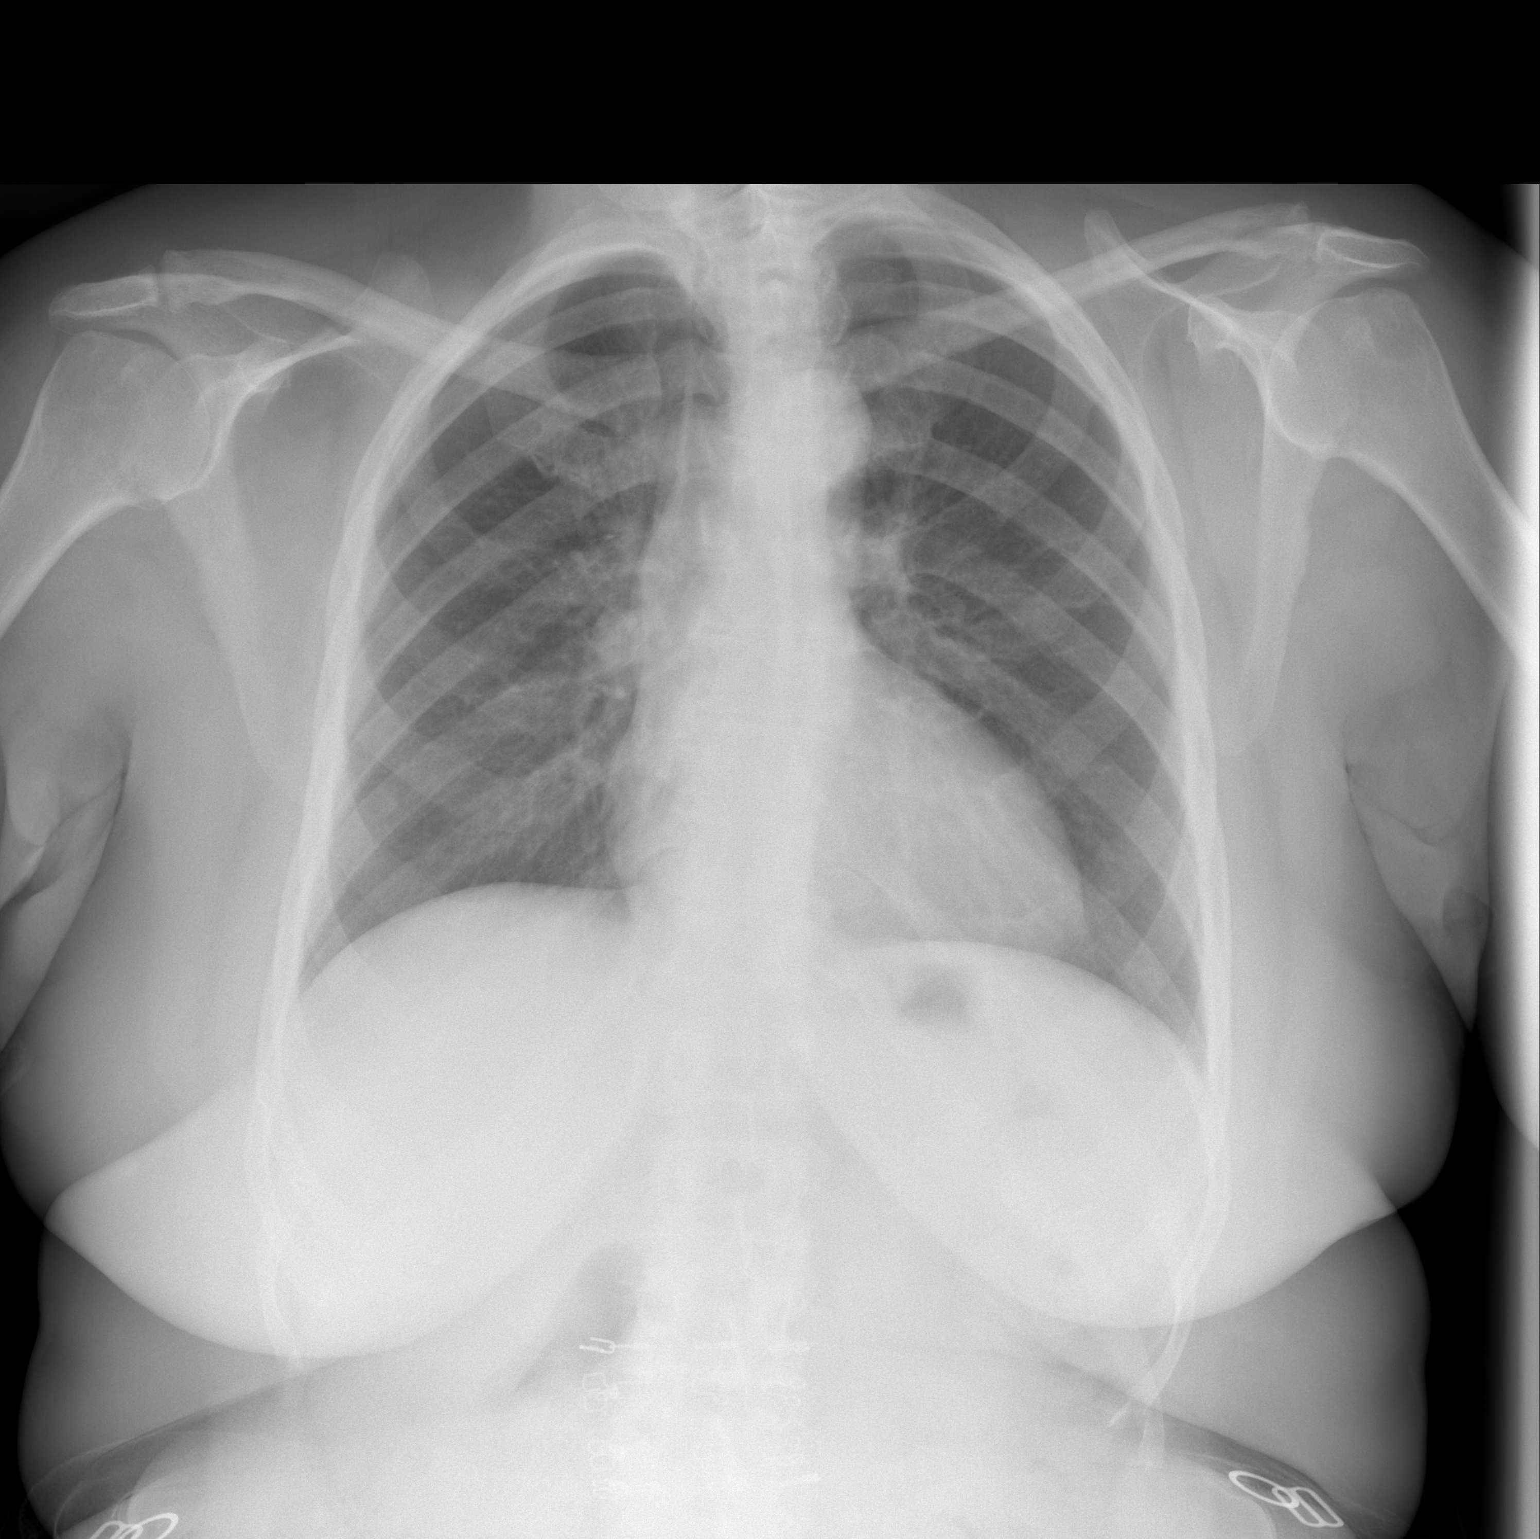

[w chest lat]
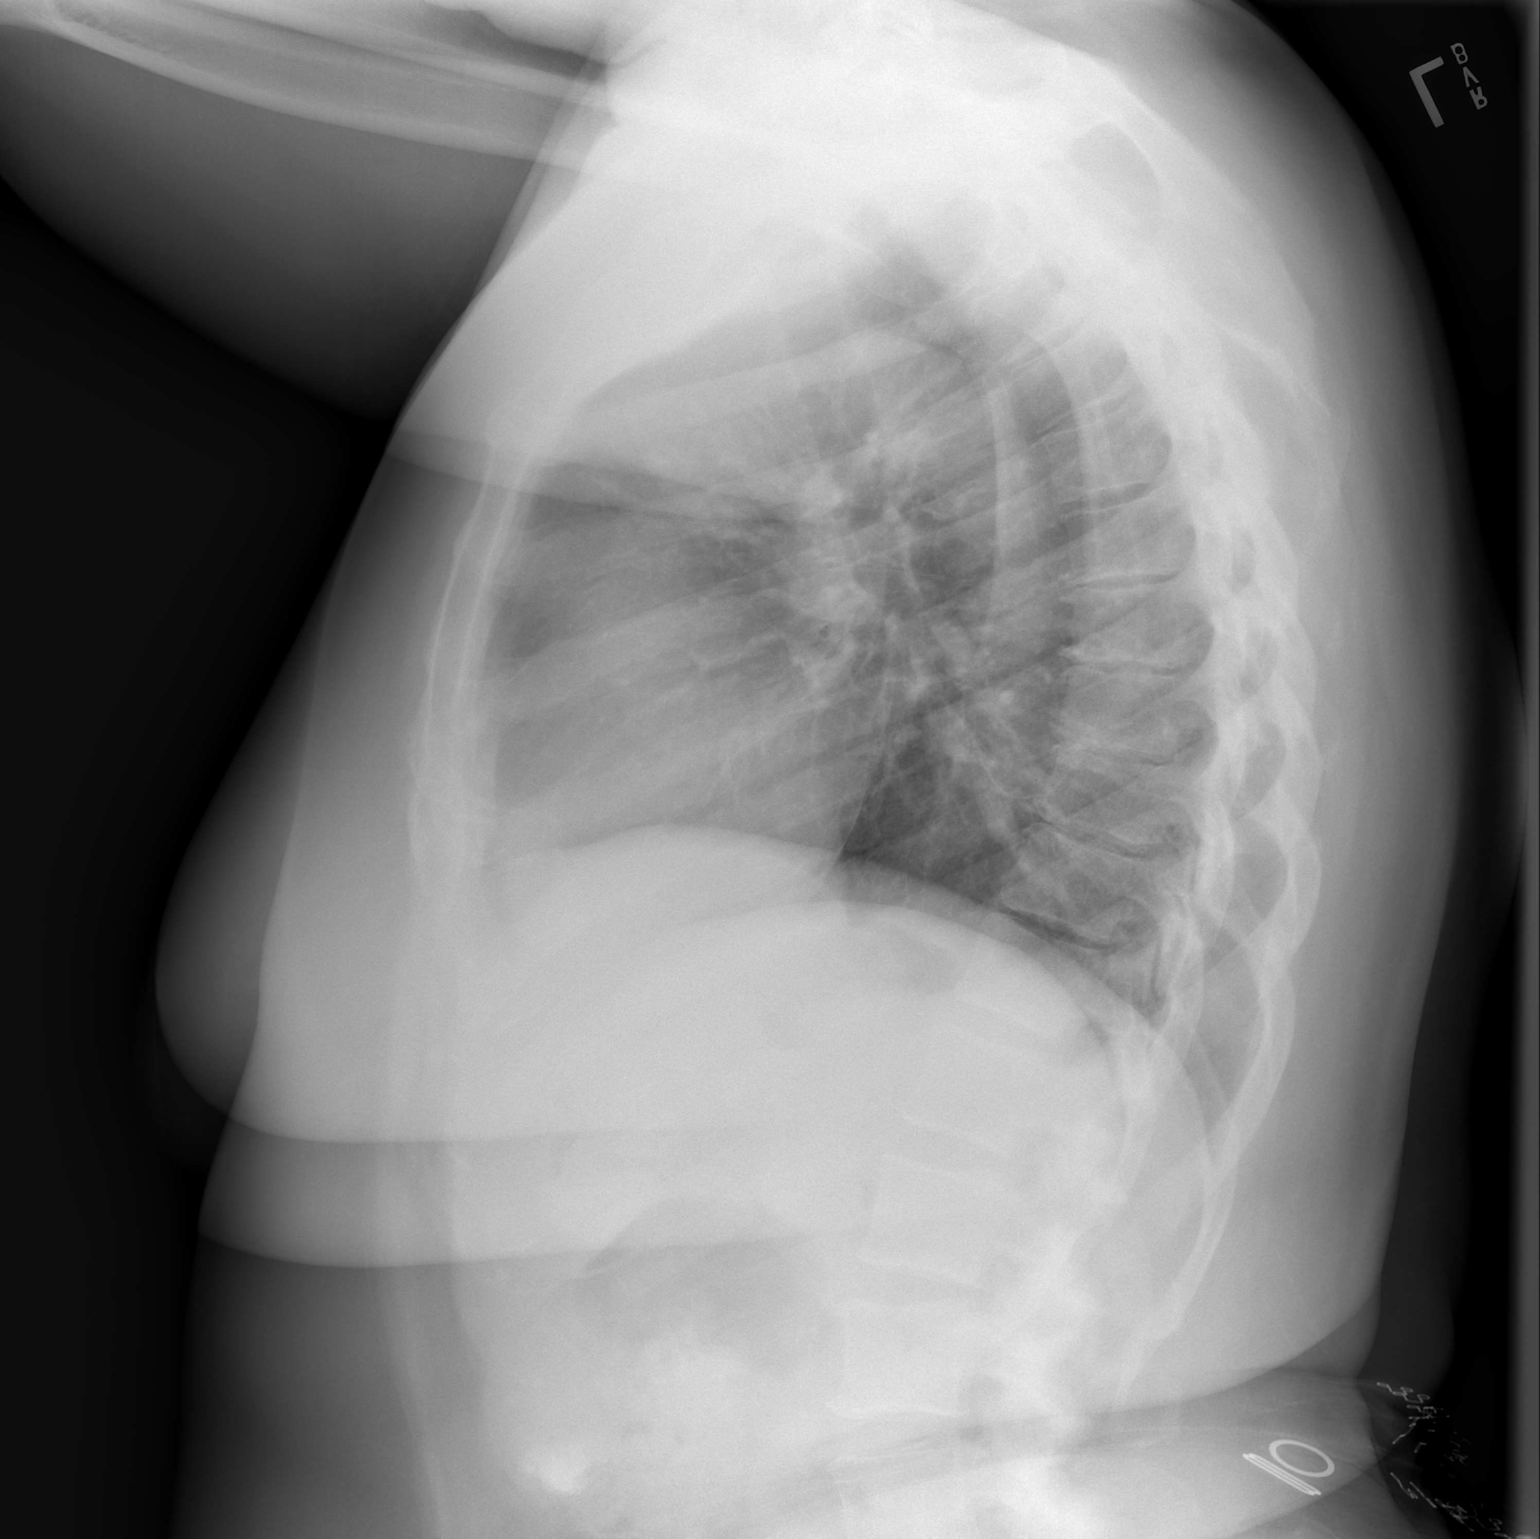

[2 of 2 positions shown; findings below may reference images not displayed]

FINDINGS: Lungs are clear. The heart size and pulmonary vascularity are
normal. No adenopathy. There is mild degenerative change in the
thoracic spine.
IMPRESSION: Lungs clear.  No evident adenopathy.

## 2021-02-20 IMAGING — RF DG HIP (WITH PELVIS) OPERATIVE*R*
1 series · 3 of 3 positions shown · non-contrast
Comparison: None.

CLINICAL DATA: Anterior right total hip arthroplasty

EXAM:
OPERATIVE RIGHT HIP (WITH PELVIS IF PERFORMED) 3 VIEWS
TECHNIQUE: Fluoroscopic spot image(s) were submitted for interpretation
post-operatively.

[Series 1: run · 3 of 3 slices shown]
[im 1/3]
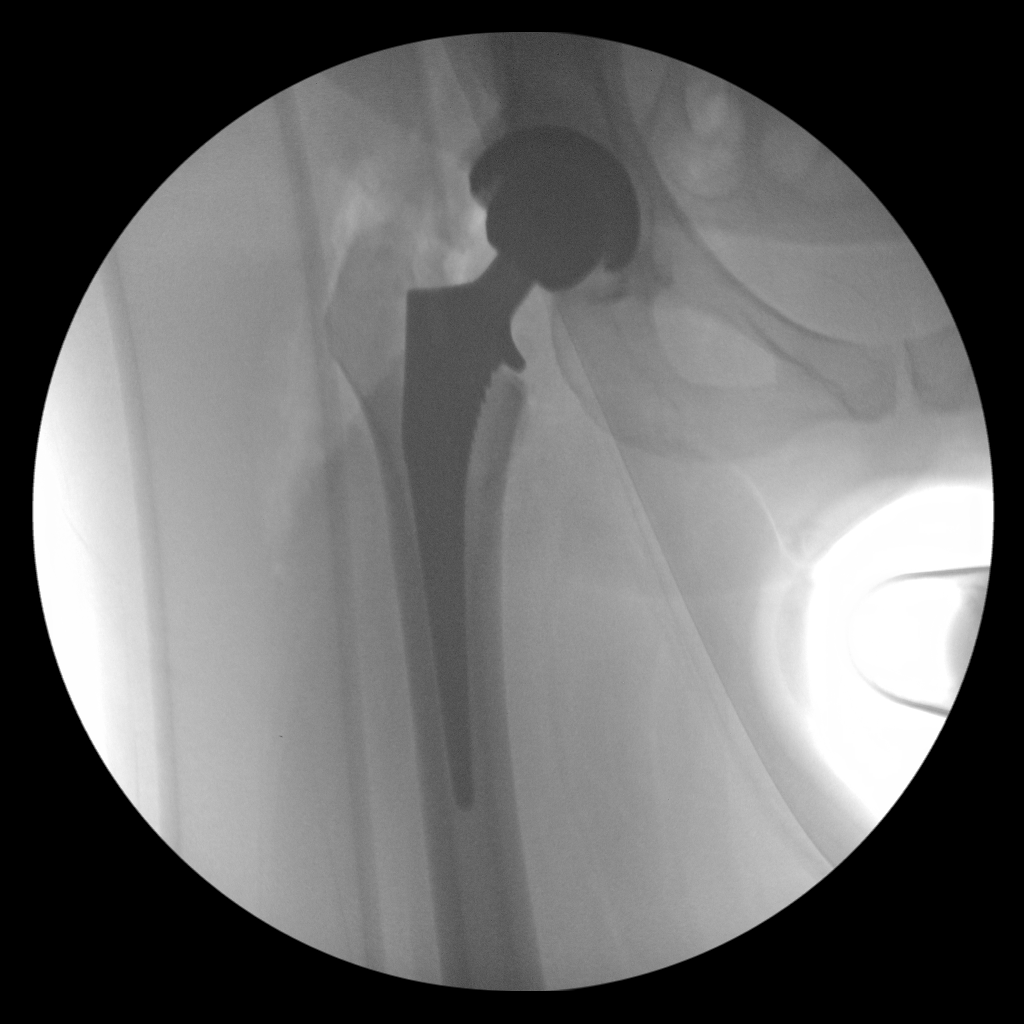
[im 2/3]
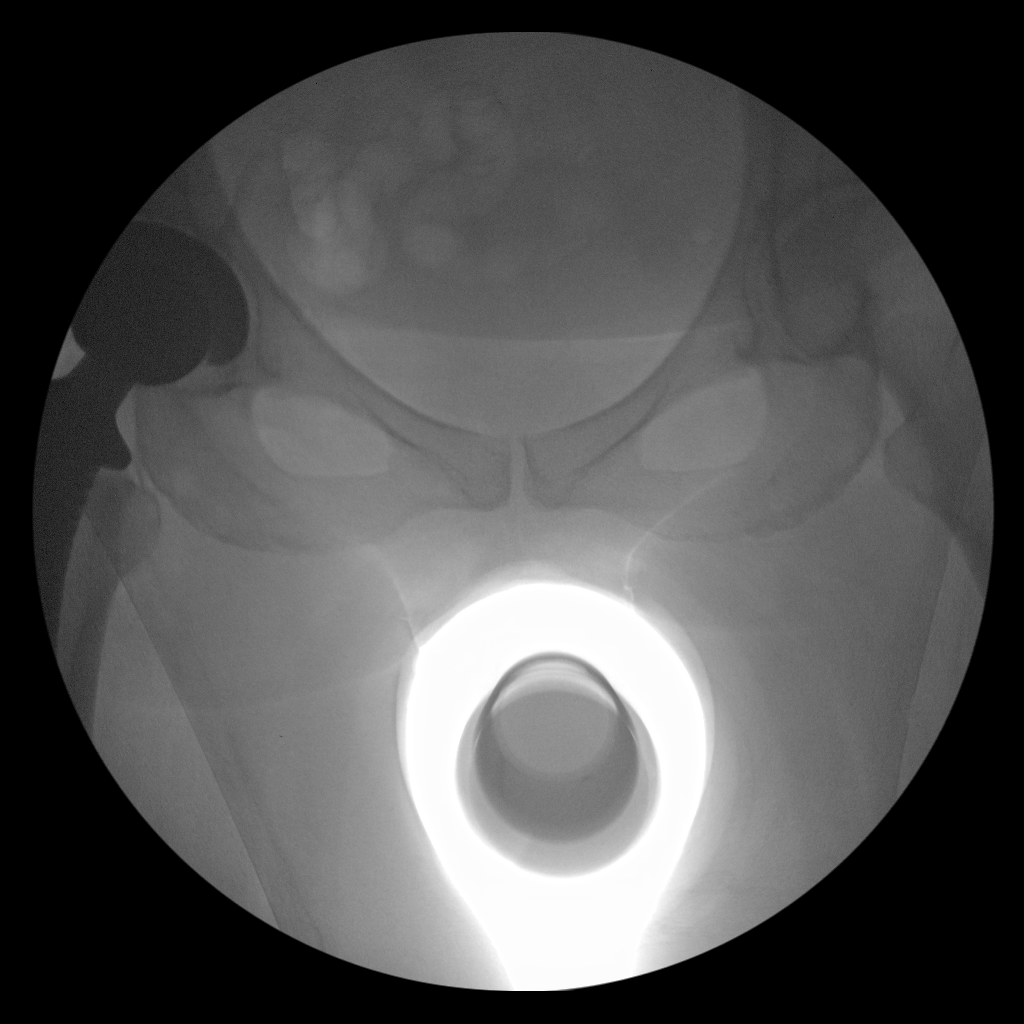
[im 3/3]
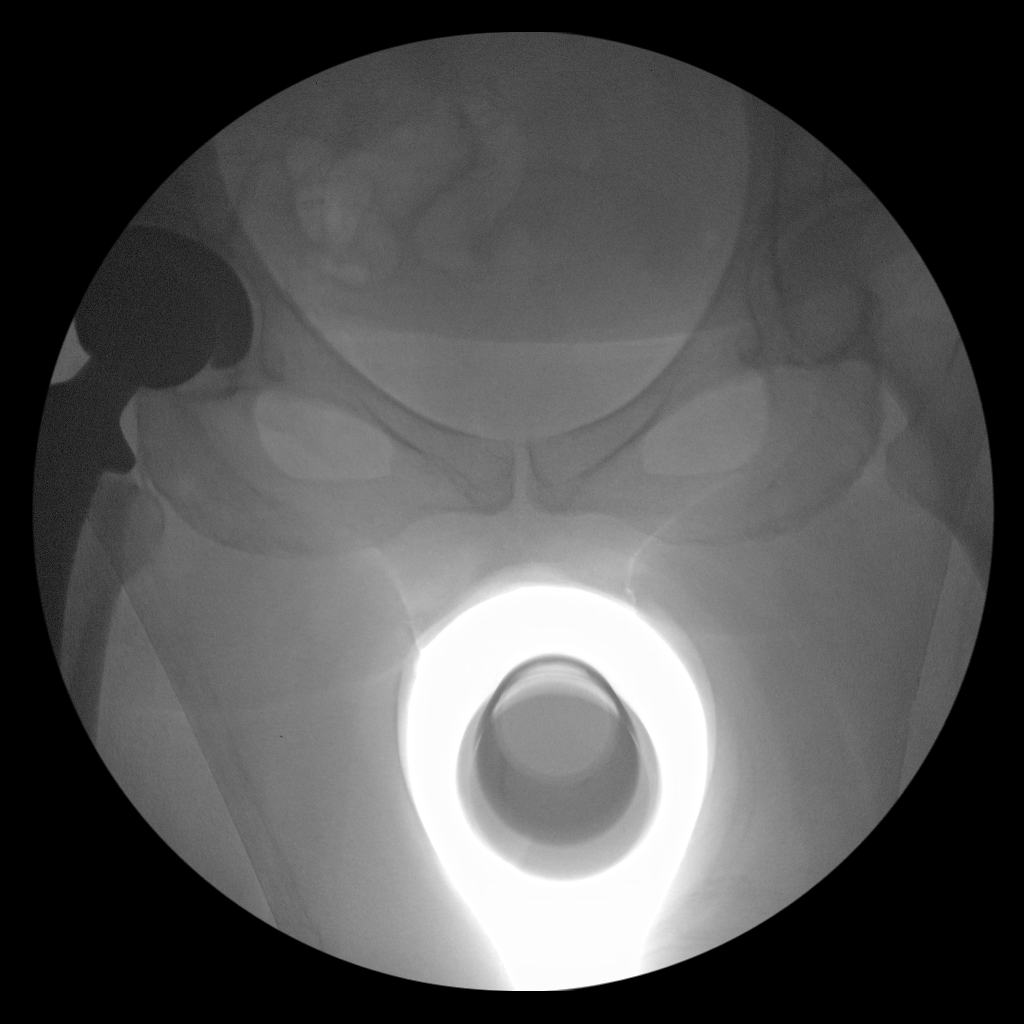

[3 of 3 positions shown; findings below may reference images not displayed]

FINDINGS: Fluoroscopy time 0 minutes 27 seconds. Multiple non diagnostic spot
fluoroscopic intraoperative right hip radiographs demonstrate
postsurgical changes from right total hip arthroplasty with no
evidence of right hip dislocation on these views.
IMPRESSION: Intraoperative fluoroscopic guidance for right total hip
arthroplasty.

## 2021-05-16 DIAGNOSIS — Z0000 Encounter for general adult medical examination without abnormal findings: Secondary | ICD-10-CM | POA: Diagnosis not present

## 2021-05-16 DIAGNOSIS — E611 Iron deficiency: Secondary | ICD-10-CM | POA: Diagnosis not present

## 2021-05-16 DIAGNOSIS — H35033 Hypertensive retinopathy, bilateral: Secondary | ICD-10-CM | POA: Diagnosis not present

## 2021-05-16 DIAGNOSIS — E538 Deficiency of other specified B group vitamins: Secondary | ICD-10-CM | POA: Diagnosis not present

## 2021-05-16 DIAGNOSIS — R5382 Chronic fatigue, unspecified: Secondary | ICD-10-CM | POA: Diagnosis not present

## 2021-05-16 DIAGNOSIS — R7303 Prediabetes: Secondary | ICD-10-CM | POA: Diagnosis not present

## 2021-05-16 DIAGNOSIS — G5603 Carpal tunnel syndrome, bilateral upper limbs: Secondary | ICD-10-CM | POA: Diagnosis not present

## 2021-05-16 DIAGNOSIS — I10 Essential (primary) hypertension: Secondary | ICD-10-CM | POA: Diagnosis not present

## 2021-05-16 DIAGNOSIS — E785 Hyperlipidemia, unspecified: Secondary | ICD-10-CM | POA: Diagnosis not present

## 2021-05-29 DIAGNOSIS — Z79890 Hormone replacement therapy (postmenopausal): Secondary | ICD-10-CM | POA: Diagnosis not present

## 2021-05-29 DIAGNOSIS — K219 Gastro-esophageal reflux disease without esophagitis: Secondary | ICD-10-CM | POA: Diagnosis not present

## 2021-05-29 DIAGNOSIS — Z6835 Body mass index (BMI) 35.0-35.9, adult: Secondary | ICD-10-CM | POA: Diagnosis not present

## 2021-05-29 DIAGNOSIS — Z96649 Presence of unspecified artificial hip joint: Secondary | ICD-10-CM | POA: Diagnosis not present

## 2021-05-29 DIAGNOSIS — I10 Essential (primary) hypertension: Secondary | ICD-10-CM | POA: Diagnosis not present

## 2021-06-12 DIAGNOSIS — Z23 Encounter for immunization: Secondary | ICD-10-CM | POA: Diagnosis not present

## 2021-06-18 DIAGNOSIS — Z1272 Encounter for screening for malignant neoplasm of vagina: Secondary | ICD-10-CM | POA: Diagnosis not present

## 2021-06-18 DIAGNOSIS — Z6834 Body mass index (BMI) 34.0-34.9, adult: Secondary | ICD-10-CM | POA: Diagnosis not present

## 2021-06-18 DIAGNOSIS — Z124 Encounter for screening for malignant neoplasm of cervix: Secondary | ICD-10-CM | POA: Diagnosis not present

## 2021-06-18 DIAGNOSIS — Z1231 Encounter for screening mammogram for malignant neoplasm of breast: Secondary | ICD-10-CM | POA: Diagnosis not present

## 2021-08-25 ENCOUNTER — Other Ambulatory Visit: Payer: Self-pay | Admitting: Gastroenterology

## 2021-08-25 DIAGNOSIS — K269 Duodenal ulcer, unspecified as acute or chronic, without hemorrhage or perforation: Secondary | ICD-10-CM

## 2021-08-28 ENCOUNTER — Telehealth: Payer: Self-pay | Admitting: Gastroenterology

## 2021-08-28 NOTE — Telephone Encounter (Signed)
Patient returned your call, please advise. 

## 2021-08-28 NOTE — Telephone Encounter (Signed)
Returned pt call to inquire further about her concerns. LVM requesting returned call.

## 2021-08-28 NOTE — Telephone Encounter (Signed)
Inbound call from patient states she is experiencing stomach pain that comes and goes like before when she had an duodenal ulcer.

## 2021-08-28 NOTE — Telephone Encounter (Signed)
Returned pt call. Inquired further about her concern. States she is needing a refill of her Pantoprazole and Bentyl. LOV 05/2019. Advised an OV will be required to further evaluate her concerns. Denies having any acute GI symptoms at this time. Merely requesting refill of medication. Pt scheduled for OV with Dr. Orvan Falconer on 12/16 @ 150pm.

## 2021-09-04 ENCOUNTER — Other Ambulatory Visit: Payer: Self-pay | Admitting: Gastroenterology

## 2021-09-04 ENCOUNTER — Telehealth: Payer: Self-pay | Admitting: Gastroenterology

## 2021-09-04 ENCOUNTER — Other Ambulatory Visit: Payer: Self-pay

## 2021-09-04 DIAGNOSIS — K269 Duodenal ulcer, unspecified as acute or chronic, without hemorrhage or perforation: Secondary | ICD-10-CM

## 2021-09-04 MED ORDER — DICYCLOMINE HCL 20 MG PO TABS: 20.0000 mg | ORAL_TABLET | Freq: Three times a day (TID) | ORAL | 0 refills | Status: DC

## 2021-09-04 MED ORDER — PANTOPRAZOLE SODIUM 40 MG PO TBEC: 40.0000 mg | DELAYED_RELEASE_TABLET | Freq: Two times a day (BID) | ORAL | 0 refills | Status: DC

## 2021-09-04 NOTE — Telephone Encounter (Signed)
Patient called and stated that her pharmacy said that her Protonix and Bentyl could not be refilled. Please advise.

## 2021-09-04 NOTE — Telephone Encounter (Signed)
Lori [name],  I called patient to reschedule her appointment with Dr. Orvan Falconer on Friday. She stated that the visit was for a medication refill for Protonix and Bentyl. Before she rescheduled she wanted to know if she would be able to get these medications. Please advise.

## 2021-09-05 NOTE — Telephone Encounter (Signed)
Called [facility] to inquire about why Rx's could not be filled. Spoke Fish farm manager, [place]. Expressed confusion, stating pt picked up Rx's. No further action required.

## 2021-09-07 ENCOUNTER — Ambulatory Visit: Payer: Medicare PPO | Admitting: Gastroenterology

## 2021-10-02 ENCOUNTER — Other Ambulatory Visit: Payer: Self-pay | Admitting: Gastroenterology

## 2021-10-02 DIAGNOSIS — K269 Duodenal ulcer, unspecified as acute or chronic, without hemorrhage or perforation: Secondary | ICD-10-CM

## 2021-10-04 DIAGNOSIS — H524 Presbyopia: Secondary | ICD-10-CM | POA: Diagnosis not present

## 2021-10-04 DIAGNOSIS — H40013 Open angle with borderline findings, low risk, bilateral: Secondary | ICD-10-CM | POA: Diagnosis not present

## 2021-10-04 DIAGNOSIS — R7309 Other abnormal glucose: Secondary | ICD-10-CM | POA: Diagnosis not present

## 2021-10-04 DIAGNOSIS — Z961 Presence of intraocular lens: Secondary | ICD-10-CM | POA: Diagnosis not present

## 2021-10-04 DIAGNOSIS — H04123 Dry eye syndrome of bilateral lacrimal glands: Secondary | ICD-10-CM | POA: Diagnosis not present

## 2021-10-10 ENCOUNTER — Telehealth: Payer: Self-pay | Admitting: Physician Assistant

## 2021-10-10 ENCOUNTER — Telehealth: Payer: Self-pay | Admitting: Gastroenterology

## 2021-10-10 ENCOUNTER — Encounter: Payer: Self-pay | Admitting: Physician Assistant

## 2021-10-10 ENCOUNTER — Telehealth (INDEPENDENT_AMBULATORY_CARE_PROVIDER_SITE_OTHER): Payer: Medicare PPO | Admitting: Physician Assistant

## 2021-10-10 DIAGNOSIS — K269 Duodenal ulcer, unspecified as acute or chronic, without hemorrhage or perforation: Secondary | ICD-10-CM

## 2021-10-10 DIAGNOSIS — K582 Mixed irritable bowel syndrome: Secondary | ICD-10-CM | POA: Diagnosis not present

## 2021-10-10 DIAGNOSIS — K2900 Acute gastritis without bleeding: Secondary | ICD-10-CM | POA: Diagnosis not present

## 2021-10-10 MED ORDER — PANTOPRAZOLE SODIUM 40 MG PO TBEC: 40.0000 mg | DELAYED_RELEASE_TABLET | Freq: Two times a day (BID) | ORAL | 3 refills | Status: DC

## 2021-10-10 MED ORDER — DICYCLOMINE HCL 20 MG PO TABS: 20.0000 mg | ORAL_TABLET | Freq: Three times a day (TID) | ORAL | 3 refills | Status: DC

## 2021-10-10 NOTE — Progress Notes (Signed)
I connected with  Lori [name] on 10/10/21 by a video enabled telemedicine application and verified that I am speaking with the correct person using two identifiers.   I discussed the limitations of evaluation and management by telemedicine. The patient expressed understanding and agreed to proceed.   Patient located at: home Provider located at: [facility] office  Chief Complaint: Refill of Pantoprazole and Dicyclomine  HPI:    Lori [name] is a 71 year old African-American female with past medical history as listed below including reflux, IBS and H. pylori ulcer, known to Dr. Orvan Falconer, who is seen via virtual visit today for refill of her Pantoprazole and Dicyclomine.       06/07/2019 patient seen via telehealth for abdominal pain by Dr. Orvan Falconer.  At that time discussed chronic diffuse abdominal pain with a change in bowel habits and blood in her stool.  Discussed previously normal colonoscopy in 2008 and 2015.  At that time recommended continuing daily psyllium for stool bulking, Dicyclomine 20 mg 4 times daily and colonoscopy/EGD to exclude organic disease.    07/13/2019 colonoscopy was normal with repeat recommended in 10 years.    09/09/2019 EGD with normal esophagus, small hiatal hernia and duodenal deformity from healed duodenal ulcers.  Pathology showed gastritis.    Today, the patient explains that as long as she takes her Pantoprazole 40 mg twice a day and her Dicyclomine 20 mg 4 times daily 20 minutes before meals and at bedtime she feels "so much better".  Describes that the feeling of a "fist in her stomach" is almost gone unless she eats the wrong thing but she has really been trying to watch what she is eating and decreasing fried/greasy/spicy foods which seem to irritate her.  She is asking for refills of her medicine.    Denies fever, chills, weight loss or change in bowel habits.  Past Medical History:  Diagnosis Date   Arthritis    knees, shoulders   Cataract     surgery   Diverticulosis    Duodenal ulcer    GERD (gastroesophageal reflux disease)    H pylori ulcer    Hypertension    IBS (irritable bowel syndrome)    Obesity    Pre-diabetes     Past Surgical History:  Procedure Laterality Date   ABDOMINAL HYSTERECTOMY     BLADDER SUSPENSION     CATARACT EXTRACTION, BILATERAL     COLONOSCOPY  06/2019   KNEE ARTHROSCOPY WITH MEDIAL MENISECTOMY Right 09/18/2016   Procedure: KNEE ARTHROSCOPY WITH MEDIAL MENISCECTOMY, LATERAL MENISCECTOMY, CHONDROPLASTY;  Surgeon: Jodi Geralds, MD;  Location: [facility];  Service: Orthopedics;  Laterality: Right;  KNEE ARTHROSCOPY, MEDIAL CHONDROPLASTY, MEDIAL LATERAL MENISCAL TEAR, MEDIAL MENEISCECTOMY, CHONDROPLASTY OF THE PATELLA   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     aspirin EC 325 MG tablet Take 1 tablet (325 mg total) by mouth 2 (two) times daily after a meal. Take x 1 month post op to decrease risk of blood clots. 60 tablet 0   Calcium Carbonate-Vitamin D (CALCIUM 600/VITAMIN D PO)  Take 1 tablet by mouth daily.     Casanthranol-Docusate Sodium 30-100 MG CAPS Take 1 capsule by mouth daily as needed (constipation).      diclofenac sodium (VOLTAREN) 1 % GEL Apply 2 g topically daily as needed (pain).      dicyclomine (BENTYL) 20 MG tablet Take 1 tablet (20 mg total) by mouth 4 (four) times daily -  before meals and at bedtime. Please schedule an Office visit for further refills. Thank you 120 tablet 0   docusate sodium (COLACE) 100 MG capsule Take 1 capsule (100 mg total) by mouth 2  (two) times daily. 30 capsule 0   estradiol (ESTRACE) 0.1 MG/GM vaginal cream Place 2 g vaginally 2 (two) times a week.      estradiol (VIVELLE-DOT) 0.1 MG/24HR Place 1 patch onto the skin 2 (two) times a week.     lisinopril-hydrochlorothiazide (PRINZIDE,ZESTORETIC) 20-12.5 MG per tablet Take 0.5 tablets by mouth daily.      Menthol-Camphor (TIGER BALM EXTRA STRENGTH) 11-10 % OINT Apply topically.     MULTIPLE VITAMIN PO Take 1 tablet by mouth daily.      oxyCODONE-acetaminophen (PERCOCET/ROXICET) 5-325 MG tablet Take 1-2 tablets by mouth every 6 (six) hours as needed for severe pain. 40 tablet 0   pantoprazole (PROTONIX) 40 MG tablet Take 1 tablet (40 mg total) by mouth 2 (two) times daily. 60 tablet 0   tiZANidine (ZANAFLEX) 2 MG tablet Take 1 tablet (2 mg total) by mouth every 8 (eight) hours as needed for muscle spasms. 40 tablet 0   trolamine salicylate (ASPERCREME) 10 % cream Apply 1 application topically as needed for muscle pain.     No current facility-administered medications for this visit.    Allergies as of 10/10/2021   (No Known Allergies)    Family History  Problem Relation Age of Onset   Hypertension Other    Hyperlipidemia Other    Heart disease Other    Diabetes Other    Obesity Other    Colon cancer Neg Hx    Esophageal cancer Neg Hx    Stomach cancer Neg Hx    Rectal cancer Neg Hx    Colon polyps Neg Hx     Social History   Socioeconomic History   Marital status: Married    Spouse name: Not on file   Number of children: Not on file   Years of education: Not on file   Highest education level: Not on file  Occupational History   Not on file  Tobacco Use   Smoking status: Never   Smokeless tobacco: Never  Vaping Use   Vaping Use: Never used  Substance and Sexual Activity   Alcohol use: No   Drug use: No   Sexual activity: Not on file  Other Topics Concern   Not on file  Social History Narrative   Not on file   Social Determinants of Health    Financial Resource Strain: Not on file  Food Insecurity: Not on file  Transportation Needs: Not on file  Physical Activity: Not on file  Stress: Not on file  Social Connections: Not on file  Intimate Partner Violence: Not on file    Review of Systems:    Constitutional: No fever or chills Cardiovascular: No chest pain Respiratory: No SOB  Gastrointestinal: See HPI and otherwise negative   Physical Exam:  Vital signs: There were no vitals taken for this visit. Telemedicine visit  Constitutional:   Pleasant AA female appears to be  in NAD, Well developed, Well nourished, alert and cooperative Head:  Normocephalic and atraumatic. Eyes:   PEERL, EOMI. No icterus. Conjunctiva pink. Ears:  Normal auditory acuity. Psychiatric: Demonstrates good judgement and reason without abnormal affect or behaviors.  No recent labs or imaging.  Assessment: 1.  Gastritis with history of duodenal ulcer: Last EGD in December 2020 with healed ulcers, symptoms well controlled on Pantoprazole 40 mg BID 2.  IBS mixed: Last colonoscopy in October 2020 was normal with repeat recommended 10 years, symptoms controlled on Bentyl 20 mg 4 times daily  Plan: 1.  Refilled Pantoprazole 40 mg BID 30-60 minutes before breakfast #180 with 3 refills was sent to the pharmacy with the patient. 2.  Refilled Dicyclomine 20 mg 4 times daily, 20-30 minutes before meals and at bedtime #360 with 3 refills 3.  Patient will follow in clinic in a year for further refills if needed or sooner if she has any acute symptoms.  Hyacinth Meeker, PA-C [facility] 10/10/2021, 2:04 PM  Cc: Shirlean Mylar, MD

## 2021-10-10 NOTE — Patient Instructions (Signed)
Lori [name],  Thanks for letting me see you today virtually.  I have refilled your Pantoprazole 40 mg twice daily and your Dicyclomine 20 mg 4 times daily.  These prescriptions were sent to the [facility] on [address] per your request.  Please let us know if you have any further questions or concerns.  Sincerely, Ellouise Newer, PA-C

## 2021-10-10 NOTE — Progress Notes (Signed)
Reviewed and agree with management plans. ? ?Ayo Smoak L. Devery Murgia, MD, MPH  ?

## 2021-10-10 NOTE — Telephone Encounter (Signed)
Ms. gia, lusher are scheduled for a virtual visit with your provider today.    Just as we do with appointments in the office, we must obtain your consent to participate.  Your consent will be active for this visit and any virtual visit you may have with one of our providers in the next 365 days.    If you have a MyChart account, I can also send a copy of this consent to you electronically.  All virtual visits are billed to your insurance company just like a traditional visit in the office.  As this is a virtual visit, video technology does not allow for your provider to perform a traditional examination.  This may limit your provider's ability to fully assess your condition.  If your provider identifies any concerns that need to be evaluated in person or the need to arrange testing such as labs, EKG, etc, we will make arrangements to do so.    Although advances in technology are sophisticated, we cannot ensure that it will always work on either your end or our end.  If the connection with a video visit is poor, we may have to switch to a telephone visit.  With either a video or telephone visit, we are not always able to ensure that we have a secure connection.   I need to obtain your verbal consent now.   Are you willing to proceed with your visit today?   KONNER WARRIOR has provided verbal consent on 10/10/2021 for a virtual visit (video or telephone).   Violet Baldy [place], [place] 10/10/2021  2:09 PM

## 2021-10-10 NOTE — Telephone Encounter (Signed)
09/07/21 appt canceled d/t Dr. Orvan Falconer being ill. Unable to refill Rx's without appt. Pantoprazole and Bentyl last filled 09/04/21 for 30 day supply per Dr. Orvan Falconer orders. Appt for 10/23/21 has been canceled per Dr. Orvan Falconer request and rescheduled to today @ 330pm with Hyacinth Meeker, PA.

## 2021-10-10 NOTE — Telephone Encounter (Signed)
Inbound call from patient states she need medication refill for pantoprazole and dicyclomine sent to [facility] on [address]

## 2021-10-23 ENCOUNTER — Ambulatory Visit: Payer: Medicare PPO | Admitting: Gastroenterology

## 2021-10-29 DIAGNOSIS — M791 Myalgia, unspecified site: Secondary | ICD-10-CM | POA: Diagnosis not present

## 2021-10-29 DIAGNOSIS — E1165 Type 2 diabetes mellitus with hyperglycemia: Secondary | ICD-10-CM | POA: Diagnosis not present

## 2021-10-29 DIAGNOSIS — H938X1 Other specified disorders of right ear: Secondary | ICD-10-CM | POA: Diagnosis not present

## 2022-02-20 DIAGNOSIS — L237 Allergic contact dermatitis due to plants, except food: Secondary | ICD-10-CM | POA: Diagnosis not present

## 2022-03-06 DIAGNOSIS — W57XXXA Bitten or stung by nonvenomous insect and other nonvenomous arthropods, initial encounter: Secondary | ICD-10-CM | POA: Diagnosis not present

## 2022-03-06 DIAGNOSIS — S70361A Insect bite (nonvenomous), right thigh, initial encounter: Secondary | ICD-10-CM | POA: Diagnosis not present

## 2022-04-18 DIAGNOSIS — E785 Hyperlipidemia, unspecified: Secondary | ICD-10-CM | POA: Diagnosis not present

## 2022-04-18 DIAGNOSIS — M1990 Unspecified osteoarthritis, unspecified site: Secondary | ICD-10-CM | POA: Diagnosis not present

## 2022-04-18 DIAGNOSIS — I10 Essential (primary) hypertension: Secondary | ICD-10-CM | POA: Diagnosis not present

## 2022-04-18 DIAGNOSIS — K219 Gastro-esophageal reflux disease without esophagitis: Secondary | ICD-10-CM | POA: Diagnosis not present

## 2022-04-18 DIAGNOSIS — E119 Type 2 diabetes mellitus without complications: Secondary | ICD-10-CM | POA: Diagnosis not present

## 2022-04-18 DIAGNOSIS — K589 Irritable bowel syndrome without diarrhea: Secondary | ICD-10-CM | POA: Diagnosis not present

## 2022-04-18 DIAGNOSIS — N951 Menopausal and female climacteric states: Secondary | ICD-10-CM | POA: Diagnosis not present

## 2022-04-18 DIAGNOSIS — R32 Unspecified urinary incontinence: Secondary | ICD-10-CM | POA: Diagnosis not present

## 2022-04-29 DIAGNOSIS — L728 Other follicular cysts of the skin and subcutaneous tissue: Secondary | ICD-10-CM | POA: Diagnosis not present

## 2022-04-29 DIAGNOSIS — L821 Other seborrheic keratosis: Secondary | ICD-10-CM | POA: Diagnosis not present

## 2022-05-20 DIAGNOSIS — Z0000 Encounter for general adult medical examination without abnormal findings: Secondary | ICD-10-CM | POA: Diagnosis not present

## 2022-05-20 DIAGNOSIS — E1165 Type 2 diabetes mellitus with hyperglycemia: Secondary | ICD-10-CM | POA: Diagnosis not present

## 2022-05-20 DIAGNOSIS — E785 Hyperlipidemia, unspecified: Secondary | ICD-10-CM | POA: Diagnosis not present

## 2022-05-20 DIAGNOSIS — I10 Essential (primary) hypertension: Secondary | ICD-10-CM | POA: Diagnosis not present

## 2022-05-20 DIAGNOSIS — Z1159 Encounter for screening for other viral diseases: Secondary | ICD-10-CM | POA: Diagnosis not present

## 2022-05-20 DIAGNOSIS — Z23 Encounter for immunization: Secondary | ICD-10-CM | POA: Diagnosis not present

## 2022-05-20 DIAGNOSIS — M1991 Primary osteoarthritis, unspecified site: Secondary | ICD-10-CM | POA: Diagnosis not present

## 2022-05-21 DIAGNOSIS — Z1159 Encounter for screening for other viral diseases: Secondary | ICD-10-CM | POA: Diagnosis not present

## 2022-05-21 DIAGNOSIS — E1165 Type 2 diabetes mellitus with hyperglycemia: Secondary | ICD-10-CM | POA: Diagnosis not present

## 2022-05-21 DIAGNOSIS — I10 Essential (primary) hypertension: Secondary | ICD-10-CM | POA: Diagnosis not present

## 2022-05-21 DIAGNOSIS — E785 Hyperlipidemia, unspecified: Secondary | ICD-10-CM | POA: Diagnosis not present

## 2022-06-06 DIAGNOSIS — M1711 Unilateral primary osteoarthritis, right knee: Secondary | ICD-10-CM | POA: Diagnosis not present

## 2022-06-06 DIAGNOSIS — M1612 Unilateral primary osteoarthritis, left hip: Secondary | ICD-10-CM | POA: Diagnosis not present

## 2022-06-06 DIAGNOSIS — M1712 Unilateral primary osteoarthritis, left knee: Secondary | ICD-10-CM | POA: Diagnosis not present

## 2022-06-06 DIAGNOSIS — M170 Bilateral primary osteoarthritis of knee: Secondary | ICD-10-CM | POA: Diagnosis not present

## 2022-06-10 DIAGNOSIS — Z23 Encounter for immunization: Secondary | ICD-10-CM | POA: Diagnosis not present

## 2022-06-25 DIAGNOSIS — M25552 Pain in left hip: Secondary | ICD-10-CM | POA: Diagnosis not present

## 2022-06-25 DIAGNOSIS — M25561 Pain in right knee: Secondary | ICD-10-CM | POA: Diagnosis not present

## 2022-06-25 DIAGNOSIS — M25562 Pain in left knee: Secondary | ICD-10-CM | POA: Diagnosis not present

## 2022-06-25 DIAGNOSIS — M170 Bilateral primary osteoarthritis of knee: Secondary | ICD-10-CM | POA: Diagnosis not present

## 2022-06-28 DIAGNOSIS — N959 Unspecified menopausal and perimenopausal disorder: Secondary | ICD-10-CM | POA: Diagnosis not present

## 2022-06-28 DIAGNOSIS — N952 Postmenopausal atrophic vaginitis: Secondary | ICD-10-CM | POA: Diagnosis not present

## 2022-06-28 DIAGNOSIS — Z1231 Encounter for screening mammogram for malignant neoplasm of breast: Secondary | ICD-10-CM | POA: Diagnosis not present

## 2022-06-28 DIAGNOSIS — Z01419 Encounter for gynecological examination (general) (routine) without abnormal findings: Secondary | ICD-10-CM | POA: Diagnosis not present

## 2022-06-28 DIAGNOSIS — Z6834 Body mass index (BMI) 34.0-34.9, adult: Secondary | ICD-10-CM | POA: Diagnosis not present

## 2022-07-05 ENCOUNTER — Other Ambulatory Visit: Payer: Self-pay

## 2022-07-05 MED ORDER — PANTOPRAZOLE SODIUM 40 MG PO TBEC: 40.0000 mg | DELAYED_RELEASE_TABLET | Freq: Two times a day (BID) | ORAL | 0 refills | Status: DC

## 2022-07-05 NOTE — Telephone Encounter (Signed)
Pantoprazole refilled,  up to date on her visits.

## 2022-10-02 ENCOUNTER — Other Ambulatory Visit: Payer: Self-pay

## 2022-10-02 NOTE — Telephone Encounter (Signed)
Opened in error

## 2022-10-07 ENCOUNTER — Encounter: Payer: Self-pay | Admitting: Physician Assistant

## 2022-10-07 ENCOUNTER — Telehealth: Payer: Self-pay | Admitting: Physician Assistant

## 2022-10-07 ENCOUNTER — Telehealth (INDEPENDENT_AMBULATORY_CARE_PROVIDER_SITE_OTHER): Payer: Medicare PPO | Admitting: Physician Assistant

## 2022-10-07 DIAGNOSIS — K2900 Acute gastritis without bleeding: Secondary | ICD-10-CM

## 2022-10-07 DIAGNOSIS — K582 Mixed irritable bowel syndrome: Secondary | ICD-10-CM | POA: Diagnosis not present

## 2022-10-07 DIAGNOSIS — K269 Duodenal ulcer, unspecified as acute or chronic, without hemorrhage or perforation: Secondary | ICD-10-CM

## 2022-10-07 MED ORDER — PANTOPRAZOLE SODIUM 40 MG PO TBEC: 40.0000 mg | DELAYED_RELEASE_TABLET | Freq: Two times a day (BID) | ORAL | 0 refills | Status: DC

## 2022-10-07 MED ORDER — DICYCLOMINE HCL 20 MG PO TABS: 20.0000 mg | ORAL_TABLET | Freq: Three times a day (TID) | ORAL | 3 refills | Status: DC

## 2022-10-07 NOTE — Progress Notes (Signed)
Virtual Visit via Video Note  I connected with Lori [name] on 10/07/22 at 11:00 AM EST by a video enabled telemedicine application and verified that I am speaking with the correct person using two identifiers.  Location: Patient: Home by herself Provider: Ruthven [name]   I discussed the limitations of evaluation and management by telemedicine and the availability of in person appointments. The patient expressed understanding and agreed to proceed.   Chief Complaint: Medication refill  HPI:    Lori [name] is a 72 year old African-American female, known to Dr. Tarri [name], with a past medical history as listed below including IBS and GERD, who was seen via virtual visit today for medication refills.  06/07/2019 patient seen via telehealth for abdominal pain by Dr. Tarri [name].  At that time discussed chronic diffuse abdominal pain with a change in bowel habits and blood in her stool.  Discussed previously normal colonoscopy in 2008 and 2015.  At that time recommended continuing daily psyllium for stool bulking, Dicyclomine 20 mg 4 times daily and colonoscopy/EGD to exclude organic disease.    07/13/2019 colonoscopy was normal with repeat recommended in 10 years.    09/09/2019 EGD with normal esophagus, small hiatal hernia and duodenal deformity from healed duodenal ulcers.  Pathology showed gastritis.    10/10/2021 virtual visit for Pantoprazole and Dicyclomine refill.    Today, the patient tells me that she is doing perfectly fine as long as she uses her Dicyclomine 20 mg 4 times a day 20 minutes before meals and at bedtime.  She also continues her Pantoprazole 40 mg twice daily before breakfast and dinner.  If she misses a dose she feels it, but as long as she is on her medicine she does well.  No new complaints or concerns today.    Denies fever, chills, weight loss, nausea, vomiting or abdominal pain.  Past Medical History:  Diagnosis Date   Arthritis    knees, shoulders    Cataract    surgery   Diverticulosis    Duodenal ulcer    GERD (gastroesophageal reflux disease)    H pylori ulcer    Hypertension    IBS (irritable bowel syndrome)    Obesity    Pre-diabetes     Past Surgical History:  Procedure Laterality Date   ABDOMINAL HYSTERECTOMY     BLADDER SUSPENSION     CATARACT EXTRACTION, BILATERAL     COLONOSCOPY  06/2019   KNEE ARTHROSCOPY WITH MEDIAL MENISECTOMY Right 09/18/2016   Procedure: KNEE ARTHROSCOPY WITH MEDIAL MENISCECTOMY, LATERAL MENISCECTOMY, CHONDROPLASTY;  Surgeon: Lori Leitz, MD;  Location: [facility];  Service: Orthopedics;  Laterality: Right;  KNEE ARTHROSCOPY, MEDIAL CHONDROPLASTY, MEDIAL LATERAL MENISCAL TEAR, MEDIAL MENEISCECTOMY, CHONDROPLASTY OF THE PATELLA   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     Calcium Carbonate-Vitamin D (CALCIUM 600/VITAMIN D PO) Take 1 tablet by mouth daily.     diclofenac sodium (VOLTAREN) 1 % GEL Apply 2 g topically daily as needed (pain).      dicyclomine (BENTYL) 20 MG tablet Take 1 tablet (20 mg total)  by mouth 4 (four) times daily -  before meals and at bedtime. Please schedule an Office visit for further refills. Thank you 360 tablet 3   docusate sodium (COLACE) 100 MG capsule Take 1 capsule (100 mg total) by mouth 2 (two) times daily. 30 capsule 0   estradiol (ESTRACE) 0.1 MG/GM vaginal cream Place 2 g vaginally 2 (two) times a week.      estradiol (VIVELLE-DOT) 0.1 MG/24HR Place 1 patch onto the skin 2 (two) times a week.     lisinopril-hydrochlorothiazide  (PRINZIDE,ZESTORETIC) 20-12.5 MG per tablet Take 0.5 tablets by mouth daily.      Menthol-Camphor (TIGER BALM EXTRA STRENGTH) 11-10 % OINT Apply topically.     MULTIPLE VITAMIN PO Take 1 tablet by mouth daily.      pantoprazole (PROTONIX) 40 MG tablet Take 1 tablet (40 mg total) by mouth 2 (two) times daily before a meal. 180 tablet 0   Red Yeast Rice Extract (RED YEAST RICE PO) Take by mouth.     trolamine salicylate (ASPERCREME) 10 % cream Apply 1 application topically as needed for muscle pain.     No current facility-administered medications for this visit.    Allergies as of 10/07/2022   (No Known Allergies)    Family History  Problem Relation Age of Onset   Hypertension Other    Hyperlipidemia Other    Heart disease Other    Diabetes Other    Obesity Other    Colon cancer Neg Hx    Esophageal cancer Neg Hx    Stomach cancer Neg Hx    Rectal cancer Neg Hx    Colon polyps Neg Hx     Social History   Socioeconomic History   Marital status: Married    Spouse name: Not on file   Number of children: Not on file   Years of education: Not on file   Highest education level: Not on file  Occupational History   Not on file  Tobacco Use   Smoking status: Never   Smokeless tobacco: Never  Vaping Use   Vaping Use: Never used  Substance and Sexual Activity   Alcohol use: No   Drug use: No   Sexual activity: Not on file  Other Topics Concern   Not on file  Social History Narrative   Not on file   Social Determinants of Health   Financial Resource Strain: Not on file  Food Insecurity: Not on file  Transportation Needs: Not on file  Physical Activity: Not on file  Stress: Not on file  Social Connections: Not on file  Intimate Partner Violence: Not on file    Review of Systems:    Constitutional: No weight loss, fever or chills  Cardiovascular: No chest pain Respiratory: No SOB  Gastrointestinal: See HPI and otherwise negative   Physical Exam:  Vital  signs: Virtual Visit- NO vitals  Constitutional:   Pleasant Caucasian female appears to be in NAD, Well developed, Well nourished, alert and cooperative Head:  Normocephalic and atraumatic. Ears:  Normal auditory acuity. Psychiatric: Demonstrates good judgement and reason without abnormal affect or behaviors.  MOST RECENT LABS AND IMAGING: CBC    Component Value Date/Time   WBC 6.8 10/15/2019 0836   RBC 4.74 10/15/2019 0836   HGB 13.4 10/15/2019 0836   HCT 39.9 10/15/2019 0836   PLT 305 10/15/2019 0836   MCV 84.2 10/15/2019 0836   MCH 28.3 10/15/2019 0836   MCHC 33.6 10/15/2019 0836   RDW 13.1  10/15/2019 0836   LYMPHSABS 1.1 10/15/2019 0836   MONOABS 0.5 10/15/2019 0836   EOSABS 0.2 10/15/2019 0836   BASOSABS 0.1 10/15/2019 0836    CMP     Component Value Date/Time   NA 138 10/15/2019 0836   K 3.6 10/15/2019 0836   CL 104 10/15/2019 0836   CO2 24 10/15/2019 0836   GLUCOSE 106 (H) 10/15/2019 0836   BUN 10 10/15/2019 0836   CREATININE 0.66 10/15/2019 0836   CALCIUM 9.1 10/15/2019 0836   PROT 7.5 10/15/2019 0836   ALBUMIN 4.3 10/15/2019 0836   AST 19 10/15/2019 0836   ALT 18 10/15/2019 0836   ALKPHOS 74 10/15/2019 0836   BILITOT 0.8 10/15/2019 0836   GFRNONAA >60 10/15/2019 0836   GFRAA >60 10/15/2019 0836    Assessment: 1.  Gastritis with history of duodenal ulcer: Last EGD in December 2020 with healed ulcers, symptoms well-controlled on Pantoprazole 40 mg twice daily 2.  IBS mixed: Last colonoscopy October 2020 normal, repeat recommended 10 years, symptoms controlled on Dicyclomine 20 mg 4 times daily  Plan: 1.  Refilled Pantoprazole 40 mg twice daily, 30-60 minutes before breakfast and and dinner, #180 with 3 refills sent to the pharmacy 2.  Refilled  Dicyclomine 20 mg 4 times daily, 20 to 30 minutes before meals and at bedtime #360 with 3 refills 3.  Patient to follow in clinic in a year for further refills or sooner if needed.  Hyacinth Meeker, PA-C [place]  [name] 10/07/2022, 10:59 AM  Cc: Shirlean Mylar, MD

## 2022-10-07 NOTE — Telephone Encounter (Signed)
Ms. maghen, group are scheduled for a virtual visit with your provider today.    Just as we do with appointments in the office, we must obtain your consent to participate.  Your consent will be active for this visit and any virtual visit you may have with one of our providers in the next 365 days.    If you have a MyChart account, I can also send a copy of this consent to you electronically.  All virtual visits are billed to your insurance company just like a traditional visit in the office.  As this is a virtual visit, video technology does not allow for your provider to perform a traditional examination.  This may limit your provider's ability to fully assess your condition.  If your provider identifies any concerns that need to be evaluated in person or the need to arrange testing such as labs, EKG, etc, we will make arrangements to do so.    Although advances in technology are sophisticated, we cannot ensure that it will always work on either your end or our end.  If the connection with a video visit is poor, we may have to switch to a telephone visit.  With either a video or telephone visit, we are not always able to ensure that we have a secure connection.   I need to obtain your verbal consent now.   Are you willing to proceed with your visit today?   BREEZIE MICUCCI has provided verbal consent on 10/07/2022 for a virtual visit (video or telephone).   Lavone Nian [place], [place] 10/07/2022  11:11 AM

## 2022-10-07 NOTE — Patient Instructions (Signed)
I have sent refills of your Dicyclomine and Pantoprazole to the pharmacy for the next year.  Call us if you have any problems.  Sincerely, Ellouise Newer, PA-C

## 2022-10-08 NOTE — Progress Notes (Signed)
Reviewed and agree with management plans. ? ?Zadkiel Dragan L. Duante Arocho, MD, MPH  ?

## 2022-12-12 DIAGNOSIS — M170 Bilateral primary osteoarthritis of knee: Secondary | ICD-10-CM | POA: Diagnosis not present

## 2022-12-12 DIAGNOSIS — M1712 Unilateral primary osteoarthritis, left knee: Secondary | ICD-10-CM | POA: Diagnosis not present

## 2022-12-12 DIAGNOSIS — M1711 Unilateral primary osteoarthritis, right knee: Secondary | ICD-10-CM | POA: Diagnosis not present

## 2022-12-27 DIAGNOSIS — M6281 Muscle weakness (generalized): Secondary | ICD-10-CM | POA: Diagnosis not present

## 2022-12-27 DIAGNOSIS — M170 Bilateral primary osteoarthritis of knee: Secondary | ICD-10-CM | POA: Diagnosis not present

## 2023-01-07 DIAGNOSIS — M170 Bilateral primary osteoarthritis of knee: Secondary | ICD-10-CM | POA: Diagnosis not present

## 2023-01-07 DIAGNOSIS — M6281 Muscle weakness (generalized): Secondary | ICD-10-CM | POA: Diagnosis not present

## 2023-01-10 DIAGNOSIS — M170 Bilateral primary osteoarthritis of knee: Secondary | ICD-10-CM | POA: Diagnosis not present

## 2023-01-10 DIAGNOSIS — M6281 Muscle weakness (generalized): Secondary | ICD-10-CM | POA: Diagnosis not present

## 2023-01-14 DIAGNOSIS — M6281 Muscle weakness (generalized): Secondary | ICD-10-CM | POA: Diagnosis not present

## 2023-01-14 DIAGNOSIS — M170 Bilateral primary osteoarthritis of knee: Secondary | ICD-10-CM | POA: Diagnosis not present

## 2023-01-17 DIAGNOSIS — M6281 Muscle weakness (generalized): Secondary | ICD-10-CM | POA: Diagnosis not present

## 2023-01-17 DIAGNOSIS — M170 Bilateral primary osteoarthritis of knee: Secondary | ICD-10-CM | POA: Diagnosis not present

## 2023-01-21 DIAGNOSIS — M170 Bilateral primary osteoarthritis of knee: Secondary | ICD-10-CM | POA: Diagnosis not present

## 2023-01-21 DIAGNOSIS — M6281 Muscle weakness (generalized): Secondary | ICD-10-CM | POA: Diagnosis not present

## 2023-01-24 DIAGNOSIS — M6281 Muscle weakness (generalized): Secondary | ICD-10-CM | POA: Diagnosis not present

## 2023-01-24 DIAGNOSIS — M170 Bilateral primary osteoarthritis of knee: Secondary | ICD-10-CM | POA: Diagnosis not present

## 2023-01-27 DIAGNOSIS — M6281 Muscle weakness (generalized): Secondary | ICD-10-CM | POA: Diagnosis not present

## 2023-01-27 DIAGNOSIS — M170 Bilateral primary osteoarthritis of knee: Secondary | ICD-10-CM | POA: Diagnosis not present

## 2023-01-28 DIAGNOSIS — H04123 Dry eye syndrome of bilateral lacrimal glands: Secondary | ICD-10-CM | POA: Diagnosis not present

## 2023-01-28 DIAGNOSIS — H35362 Drusen (degenerative) of macula, left eye: Secondary | ICD-10-CM | POA: Diagnosis not present

## 2023-01-28 DIAGNOSIS — H40013 Open angle with borderline findings, low risk, bilateral: Secondary | ICD-10-CM | POA: Diagnosis not present

## 2023-01-28 DIAGNOSIS — H35032 Hypertensive retinopathy, left eye: Secondary | ICD-10-CM | POA: Diagnosis not present

## 2023-01-29 DIAGNOSIS — M6281 Muscle weakness (generalized): Secondary | ICD-10-CM | POA: Diagnosis not present

## 2023-01-29 DIAGNOSIS — M170 Bilateral primary osteoarthritis of knee: Secondary | ICD-10-CM | POA: Diagnosis not present

## 2023-02-04 DIAGNOSIS — M1711 Unilateral primary osteoarthritis, right knee: Secondary | ICD-10-CM | POA: Diagnosis not present

## 2023-02-04 DIAGNOSIS — M6281 Muscle weakness (generalized): Secondary | ICD-10-CM | POA: Diagnosis not present

## 2023-02-04 DIAGNOSIS — M1712 Unilateral primary osteoarthritis, left knee: Secondary | ICD-10-CM | POA: Diagnosis not present

## 2023-02-04 DIAGNOSIS — M170 Bilateral primary osteoarthritis of knee: Secondary | ICD-10-CM | POA: Diagnosis not present

## 2023-02-06 DIAGNOSIS — M170 Bilateral primary osteoarthritis of knee: Secondary | ICD-10-CM | POA: Diagnosis not present

## 2023-02-06 DIAGNOSIS — M6281 Muscle weakness (generalized): Secondary | ICD-10-CM | POA: Diagnosis not present

## 2023-02-13 DIAGNOSIS — M6281 Muscle weakness (generalized): Secondary | ICD-10-CM | POA: Diagnosis not present

## 2023-02-13 DIAGNOSIS — M170 Bilateral primary osteoarthritis of knee: Secondary | ICD-10-CM | POA: Diagnosis not present

## 2023-02-18 DIAGNOSIS — M170 Bilateral primary osteoarthritis of knee: Secondary | ICD-10-CM | POA: Diagnosis not present

## 2023-02-18 DIAGNOSIS — M6281 Muscle weakness (generalized): Secondary | ICD-10-CM | POA: Diagnosis not present

## 2023-02-20 DIAGNOSIS — M6281 Muscle weakness (generalized): Secondary | ICD-10-CM | POA: Diagnosis not present

## 2023-02-20 DIAGNOSIS — M170 Bilateral primary osteoarthritis of knee: Secondary | ICD-10-CM | POA: Diagnosis not present

## 2023-02-25 DIAGNOSIS — M170 Bilateral primary osteoarthritis of knee: Secondary | ICD-10-CM | POA: Diagnosis not present

## 2023-02-25 DIAGNOSIS — M25561 Pain in right knee: Secondary | ICD-10-CM | POA: Diagnosis not present

## 2023-02-25 DIAGNOSIS — M25562 Pain in left knee: Secondary | ICD-10-CM | POA: Diagnosis not present

## 2023-04-01 DIAGNOSIS — M1712 Unilateral primary osteoarthritis, left knee: Secondary | ICD-10-CM | POA: Diagnosis not present

## 2023-04-02 DIAGNOSIS — K589 Irritable bowel syndrome without diarrhea: Secondary | ICD-10-CM | POA: Diagnosis not present

## 2023-04-02 DIAGNOSIS — Z6834 Body mass index (BMI) 34.0-34.9, adult: Secondary | ICD-10-CM | POA: Diagnosis not present

## 2023-04-02 DIAGNOSIS — I10 Essential (primary) hypertension: Secondary | ICD-10-CM | POA: Diagnosis not present

## 2023-04-02 DIAGNOSIS — Z961 Presence of intraocular lens: Secondary | ICD-10-CM | POA: Diagnosis not present

## 2023-04-02 DIAGNOSIS — E669 Obesity, unspecified: Secondary | ICD-10-CM | POA: Diagnosis not present

## 2023-04-02 DIAGNOSIS — N951 Menopausal and female climacteric states: Secondary | ICD-10-CM | POA: Diagnosis not present

## 2023-04-02 DIAGNOSIS — Z79890 Hormone replacement therapy (postmenopausal): Secondary | ICD-10-CM | POA: Diagnosis not present

## 2023-04-02 DIAGNOSIS — Z8249 Family history of ischemic heart disease and other diseases of the circulatory system: Secondary | ICD-10-CM | POA: Diagnosis not present

## 2023-04-02 DIAGNOSIS — M1990 Unspecified osteoarthritis, unspecified site: Secondary | ICD-10-CM | POA: Diagnosis not present

## 2023-04-08 DIAGNOSIS — M1712 Unilateral primary osteoarthritis, left knee: Secondary | ICD-10-CM | POA: Diagnosis not present

## 2023-04-09 ENCOUNTER — Telehealth: Payer: Self-pay | Admitting: Physician Assistant

## 2023-04-09 ENCOUNTER — Other Ambulatory Visit: Payer: Self-pay | Admitting: *Deleted

## 2023-04-09 MED ORDER — PANTOPRAZOLE SODIUM 40 MG PO TBEC: 40.0000 mg | DELAYED_RELEASE_TABLET | Freq: Two times a day (BID) | ORAL | 3 refills | Status: DC

## 2023-04-09 NOTE — Telephone Encounter (Signed)
Inbound call from patient requesting refill for pantoprazole medication. Requesting a call back regarding refill. States it is okay to leave a detailed voicemail if she is unable to answer phone call. Please advise, thank you.

## 2023-04-09 NOTE — Telephone Encounter (Signed)
Script sent to refill. Patient informed.

## 2023-04-15 DIAGNOSIS — M1712 Unilateral primary osteoarthritis, left knee: Secondary | ICD-10-CM | POA: Diagnosis not present

## 2023-05-15 DIAGNOSIS — M25562 Pain in left knee: Secondary | ICD-10-CM | POA: Diagnosis not present

## 2023-05-23 DIAGNOSIS — E785 Hyperlipidemia, unspecified: Secondary | ICD-10-CM | POA: Diagnosis not present

## 2023-05-23 DIAGNOSIS — E1165 Type 2 diabetes mellitus with hyperglycemia: Secondary | ICD-10-CM | POA: Diagnosis not present

## 2023-05-23 DIAGNOSIS — I10 Essential (primary) hypertension: Secondary | ICD-10-CM | POA: Diagnosis not present

## 2023-05-23 DIAGNOSIS — E669 Obesity, unspecified: Secondary | ICD-10-CM | POA: Diagnosis not present

## 2023-05-23 DIAGNOSIS — Z23 Encounter for immunization: Secondary | ICD-10-CM | POA: Diagnosis not present

## 2023-05-23 DIAGNOSIS — Z0000 Encounter for general adult medical examination without abnormal findings: Secondary | ICD-10-CM | POA: Diagnosis not present

## 2023-06-30 DIAGNOSIS — Z6834 Body mass index (BMI) 34.0-34.9, adult: Secondary | ICD-10-CM | POA: Diagnosis not present

## 2023-06-30 DIAGNOSIS — Z01419 Encounter for gynecological examination (general) (routine) without abnormal findings: Secondary | ICD-10-CM | POA: Diagnosis not present

## 2023-06-30 DIAGNOSIS — N959 Unspecified menopausal and perimenopausal disorder: Secondary | ICD-10-CM | POA: Diagnosis not present

## 2023-06-30 DIAGNOSIS — Z1231 Encounter for screening mammogram for malignant neoplasm of breast: Secondary | ICD-10-CM | POA: Diagnosis not present

## 2023-06-30 DIAGNOSIS — N952 Postmenopausal atrophic vaginitis: Secondary | ICD-10-CM | POA: Diagnosis not present

## 2023-09-02 DIAGNOSIS — E785 Hyperlipidemia, unspecified: Secondary | ICD-10-CM | POA: Diagnosis not present

## 2023-12-08 ENCOUNTER — Telehealth: Payer: Self-pay | Admitting: Physician Assistant

## 2023-12-08 DIAGNOSIS — K269 Duodenal ulcer, unspecified as acute or chronic, without hemorrhage or perforation: Secondary | ICD-10-CM

## 2023-12-08 MED ORDER — DICYCLOMINE HCL 20 MG PO TABS: 20.0000 mg | ORAL_TABLET | Freq: Three times a day (TID) | ORAL | 0 refills | Status: DC

## 2023-12-08 NOTE — Telephone Encounter (Signed)
 PT is calling to have a new prescription sent in for dicyclomine. Her OV is not until 5/28 and she has none of the medication left. She still uses [facility] on [address]. Please advise.

## 2023-12-08 NOTE — Telephone Encounter (Signed)
 Sent script to pharmacy.

## 2024-01-19 ENCOUNTER — Telehealth: Payer: Self-pay | Admitting: Physician Assistant

## 2024-01-19 DIAGNOSIS — K269 Duodenal ulcer, unspecified as acute or chronic, without hemorrhage or perforation: Secondary | ICD-10-CM

## 2024-01-19 NOTE — Telephone Encounter (Signed)
 Good afternoon Lori [name]  The following patient has an appointment scheduled with you 5/28 and is requesting to have it as a telephone appointment. Please advise of scheduling. Thank you.

## 2024-01-21 MED ORDER — DICYCLOMINE HCL 20 MG PO TABS: 20.0000 mg | ORAL_TABLET | Freq: Three times a day (TID) | ORAL | 3 refills | Status: AC

## 2024-01-21 MED ORDER — PANTOPRAZOLE SODIUM 40 MG PO TBEC: 40.0000 mg | DELAYED_RELEASE_TABLET | Freq: Two times a day (BID) | ORAL | 3 refills | Status: AC

## 2024-01-21 NOTE — Telephone Encounter (Signed)
 Spoke with patient she states she is feeling fine currently. Sent in refills for 1 year on Pantoprazole  and Dicyclomine . Recall placed for 09/2024

## 2024-02-03 DIAGNOSIS — H524 Presbyopia: Secondary | ICD-10-CM | POA: Diagnosis not present

## 2024-02-03 DIAGNOSIS — H35362 Drusen (degenerative) of macula, left eye: Secondary | ICD-10-CM | POA: Diagnosis not present

## 2024-02-03 DIAGNOSIS — R7309 Other abnormal glucose: Secondary | ICD-10-CM | POA: Diagnosis not present

## 2024-02-03 DIAGNOSIS — H40013 Open angle with borderline findings, low risk, bilateral: Secondary | ICD-10-CM | POA: Diagnosis not present

## 2024-02-03 DIAGNOSIS — H35032 Hypertensive retinopathy, left eye: Secondary | ICD-10-CM | POA: Diagnosis not present

## 2024-02-18 ENCOUNTER — Ambulatory Visit: Admitting: Physician Assistant

## 2024-06-04 DIAGNOSIS — K5909 Other constipation: Secondary | ICD-10-CM | POA: Diagnosis not present

## 2024-06-04 DIAGNOSIS — I10 Essential (primary) hypertension: Secondary | ICD-10-CM | POA: Diagnosis not present

## 2024-06-04 DIAGNOSIS — M1991 Primary osteoarthritis, unspecified site: Secondary | ICD-10-CM | POA: Diagnosis not present

## 2024-06-04 DIAGNOSIS — Z0000 Encounter for general adult medical examination without abnormal findings: Secondary | ICD-10-CM | POA: Diagnosis not present

## 2024-06-04 DIAGNOSIS — E1165 Type 2 diabetes mellitus with hyperglycemia: Secondary | ICD-10-CM | POA: Diagnosis not present

## 2024-06-04 DIAGNOSIS — E785 Hyperlipidemia, unspecified: Secondary | ICD-10-CM | POA: Diagnosis not present

## 2024-06-04 DIAGNOSIS — E559 Vitamin D deficiency, unspecified: Secondary | ICD-10-CM | POA: Diagnosis not present

## 2024-06-04 DIAGNOSIS — Z23 Encounter for immunization: Secondary | ICD-10-CM | POA: Diagnosis not present

## 2024-07-02 DIAGNOSIS — Z1231 Encounter for screening mammogram for malignant neoplasm of breast: Secondary | ICD-10-CM | POA: Diagnosis not present

## 2024-07-16 DIAGNOSIS — H43392 Other vitreous opacities, left eye: Secondary | ICD-10-CM | POA: Diagnosis not present

## 2024-07-19 DIAGNOSIS — R252 Cramp and spasm: Secondary | ICD-10-CM | POA: Diagnosis not present

## 2024-07-19 DIAGNOSIS — E785 Hyperlipidemia, unspecified: Secondary | ICD-10-CM | POA: Diagnosis not present

## 2024-08-09 ENCOUNTER — Other Ambulatory Visit (HOSPITAL_COMMUNITY): Payer: Self-pay | Admitting: Family Medicine

## 2024-08-09 ENCOUNTER — Ambulatory Visit (HOSPITAL_COMMUNITY)
Admission: RE | Admit: 2024-08-09 | Discharge: 2024-08-09 | Disposition: A | Discharge status: Home / Self Care | Source: Ambulatory Visit | Attending: Surgery | Admitting: Surgery

## 2024-08-09 DIAGNOSIS — M79661 Pain in right lower leg: Secondary | ICD-10-CM | POA: Insufficient documentation

## 2024-08-09 DIAGNOSIS — M79662 Pain in left lower leg: Secondary | ICD-10-CM | POA: Insufficient documentation

## 2024-08-09 LAB — VAS US ABI WITH/WO TBI
Left ABI: 1.23
Right ABI: 1.25
